# Patient Record
Sex: Male | Born: 2000 | Race: White | Hispanic: No | Marital: Single | Smoking: Never smoker
Health system: Southern US, Community
[De-identification: ages and names within clinical notes are randomized; demographics above are authoritative.]

---
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
## 2019-04-01 ENCOUNTER — Encounter: Payer: Self-pay | Admitting: Family Medicine

## 2019-04-01 ENCOUNTER — Ambulatory Visit (INDEPENDENT_AMBULATORY_CARE_PROVIDER_SITE_OTHER): Payer: Medicaid Other | Admitting: Family Medicine

## 2019-04-01 ENCOUNTER — Other Ambulatory Visit: Payer: Self-pay

## 2019-04-01 VITALS — BP 120/60 | HR 66 | Ht 68.0 in | Wt 146.4 lb

## 2019-04-01 DIAGNOSIS — Z8342 Family history of familial hypercholesterolemia: Secondary | ICD-10-CM | POA: Diagnosis not present

## 2019-04-01 DIAGNOSIS — Z0000 Encounter for general adult medical examination without abnormal findings: Secondary | ICD-10-CM | POA: Diagnosis not present

## 2019-04-01 DIAGNOSIS — E639 Nutritional deficiency, unspecified: Secondary | ICD-10-CM | POA: Diagnosis not present

## 2019-04-01 DIAGNOSIS — G479 Sleep disorder, unspecified: Secondary | ICD-10-CM | POA: Diagnosis not present

## 2019-04-01 NOTE — Assessment & Plan Note (Signed)
Although there is not acute concern for this a medical standpoint, due to family history and mother and patient level of concern about it, will obtain a lipid panel.

## 2019-04-01 NOTE — Progress Notes (Signed)
   Subjective:    Patient ID: Kenneth Foley, male    DOB: 2000/10/31, 18 y.o.   MRN: [id number]   CC: Annual physical  HPI: [name], 18 year old male with a history of autism presents for an annual physical today with his mother who provides history.  Patient is very high functioning autistic, but he has difficulty being in social situations and often feels as others are judging him.  He is an otherwise healthy young man with no particular concerns today.  His mother brings up concerns of his diet, he eats only fast food very few fruits and vegetables.  He also has trouble sleeping, he reports that he falls asleep around 1 AM, he will wake up around 3 AM to eat, and goes back to sleep.  He watches TV to fall asleep at night.  He only gets between 5 and 6 hours total on night.  His mother points out that he frequently on his phone and night and does not have a regular bedtime routine.  He is stopped walking and exercising regularly as of late.  They have a family history of diabetes, hypertension, high cholesterol.  He is not currently working, he does not have many close friends, prefers to be alone.  He does not smoke, drink alcohol, use recreational drugs of any type, vape, and he is not sexually active.  Smoking status reviewed   ROS: pertinent noted in the HPI   No past medical history on file.  No past surgical history on file.  Past medical history, surgical, family, and social history reviewed and updated in the EMR as appropriate.  Objective:  BP 120/60   Pulse 66   Ht 5\' 8"  (1.727 m)   Wt 146 lb 6 oz (66.4 kg)   SpO2 97%   BMI 22.26 kg/m   Vitals and nursing note reviewed  General: NAD, appropriately groomed young man, able to participate in exam Cardiac: RRR, S1 S2 present. normal heart sounds, no murmurs. Respiratory: CTAB, normal effort, No wheezes, rales or rhonchi Extremities: no edema or cyanosis. Skin: warm and dry, no rashes noted Neuro: alert, no  obvious focal deficits Psych: flat affect, averts gaze away from speaker, is uncomfortable maintaining eye contact while speaking, rate of speech is fast and monotone   Assessment & Plan:  Kenneth Foley is an otherwise healthy 18 year old man presenting for an annual physical today.  Difficulty sleeping Discussed appropriate sleep hygiene with patient, including using nightime setting on phone, decreasing electronic usage after 10 PM, publishing a regular routine to help the body wind down, exercising during the day to be more tired night, hand out given.  Poor diet Discussed with patient healthy eating habits, importance of this especially as 1 ages, and if one has a family history of diabetes.  Handout given.  Family history of high cholesterol Although there is not acute concern for this a medical standpoint, due to family history and mother and patient level of concern about it, will obtain a lipid panel.  Gladys Damme, MD [place] PGY-1

## 2019-04-01 NOTE — Assessment & Plan Note (Signed)
Discussed with patient healthy eating habits, importance of this especially as 1 ages, and if one has a family history of diabetes.  Handout given.

## 2019-04-01 NOTE — Patient Instructions (Addendum)
Pleasure seeing you today!  1.  I recommend you good use good sleep hygiene including turning on your phones night mild, or not using electronics after 10 PM.  2.  I recommend exercise and healthy eating, tips for which she can see below.  3.  I have taken some labs to test for high cholesterol, if anything is abnormal you will hear from me.  4.  Recommend regular self exam of your scrotum to prevent testicular cancer.  Be Well!  Health Maintenance, Male Adopting a healthy lifestyle and getting preventive care are important in promoting health and wellness. Ask your health care provider about:  The right schedule for you to have regular tests and exams.  Things you can do on your own to prevent diseases and keep yourself healthy. What should I know about diet, weight, and exercise? Eat a healthy diet   Eat a diet that includes plenty of vegetables, fruits, low-fat dairy products, and lean protein.  Do not eat a lot of foods that are high in solid fats, added sugars, or sodium. Maintain a healthy weight Body mass index (BMI) is a measurement that can be used to identify possible weight problems. It estimates body fat based on height and weight. Your health care provider can help determine your BMI and help you achieve or maintain a healthy weight. Get regular exercise Get regular exercise. This is one of the most important things you can do for your health. Most adults should:  Exercise for at least 150 minutes each week. The exercise should increase your heart rate and make you sweat (moderate-intensity exercise).  Do strengthening exercises at least twice a week. This is in addition to the moderate-intensity exercise.  Spend less time sitting. Even light physical activity can be beneficial. Watch cholesterol and blood lipids Have your blood tested for lipids and cholesterol at 19 years of age, then have this test every 5 years. You may need to have your cholesterol levels  checked more often if:  Your lipid or cholesterol levels are high.  You are older than 19 years of age.  You are at high risk for heart disease. What should I know about cancer screening? Many types of cancers can be detected early and may often be prevented. Depending on your health history and family history, you may need to have cancer screening at various ages. This may include screening for:  Colorectal cancer.  Prostate cancer.  Skin cancer.  Lung cancer. What should I know about heart disease, diabetes, and high blood pressure? Blood pressure and heart disease  High blood pressure causes heart disease and increases the risk of stroke. This is more likely to develop in people who have high blood pressure readings, are of African descent, or are overweight.  Talk with your health care provider about your target blood pressure readings.  Have your blood pressure checked: ? Every 3-5 years if you are 73-24 years of age. ? Every year if you are 27 years old or older.  If you are between the ages of 67 and 61 and are a current or former smoker, ask your health care provider if you should have a one-time screening for abdominal aortic aneurysm (AAA). Diabetes Have regular diabetes screenings. This checks your fasting blood sugar level. Have the screening done:  Once every three years after age 57 if you are at a normal weight and have a low risk for diabetes.  More often and at a younger age if you are  overweight or have a high risk for diabetes. What should I know about preventing infection? Hepatitis B If you have a higher risk for hepatitis B, you should be screened for this virus. Talk with your health care provider to find out if you are at risk for hepatitis B infection. Hepatitis C Blood testing is recommended for:  Everyone born from [birth year] through 1966.  Anyone with known risk factors for hepatitis C. Sexually transmitted infections (STIs)  You should be screened  each year for STIs, including gonorrhea and chlamydia, if: ? You are sexually active and are younger than 19 years of age. ? You are older than 19 years of age and your health care provider tells you that you are at risk for this type of infection. ? Your sexual activity has changed since you were last screened, and you are at increased risk for chlamydia or gonorrhea. Ask your health care provider if you are at risk.  Ask your health care provider about whether you are at high risk for HIV. Your health care provider may recommend a prescription medicine to help prevent HIV infection. If you choose to take medicine to prevent HIV, you should first get tested for HIV. You should then be tested every 3 months for as long as you are taking the medicine. Follow these instructions at home: Lifestyle  Do not use any products that contain nicotine or tobacco, such as cigarettes, e-cigarettes, and chewing tobacco. If you need help quitting, ask your health care provider.  Do not use street drugs.  Do not share needles.  Ask your health care provider for help if you need support or information about quitting drugs. Alcohol use  Do not drink alcohol if your health care provider tells you not to drink.  If you drink alcohol: ? Limit how much you have to 0-2 drinks a day. ? Be aware of how much alcohol is in your drink. In the [place] one drink equals one 12 oz bottle of beer (355 mL), one 5 oz glass of wine (148 mL), or one 1 oz glass of hard liquor (44 mL). General instructions  Schedule regular health, dental, and eye exams.  Stay current with your vaccines.  Tell your health care provider if: ? You often feel depressed. ? You have ever been abused or do not feel safe at home. Summary  Adopting a healthy lifestyle and getting preventive care are important in promoting health and wellness.  Follow your health care provider's instructions about healthy diet, exercising, and getting tested or  screened for diseases.  Follow your health care provider's instructions on monitoring your cholesterol and blood pressure. This information is not intended to replace advice given to you by your health care provider. Make sure you discuss any questions you have with your health care provider. Document Released: 12/14/2007 Document Revised: 06/10/2018 Document Reviewed: 06/10/2018 Elsevier Patient Education  2020 [employer].

## 2019-04-01 NOTE — Assessment & Plan Note (Signed)
Discussed appropriate sleep hygiene with patient, including using nightime setting on phone, decreasing electronic usage after 10 PM, publishing a regular routine to help the body wind down, exercising during the day to be more tired night, hand out given.

## 2019-04-02 LAB — LIPID PANEL
Chol/HDL Ratio: 4.2 ratio (ref 0.0–5.0)
Cholesterol, Total: 150 mg/dL (ref 100–169)
HDL: 36 mg/dL — ABNORMAL LOW (ref >39)
LDL Chol Calc (NIH): 96 mg/dL (ref 0–109)
Triglycerides: 95 mg/dL — ABNORMAL HIGH (ref 0–89)
VLDL Cholesterol Cal: 18 mg/dL (ref 5–40)

## 2019-04-07 NOTE — Progress Notes (Signed)
Discussed results with patient's mother, advised varying diet to have more fruits and vegetables, increase exercise like walking to improve triglycerides and HDL.

## 2019-10-08 ENCOUNTER — Encounter: Payer: Self-pay | Admitting: Family Medicine

## 2019-10-08 ENCOUNTER — Other Ambulatory Visit: Payer: Self-pay

## 2019-10-08 ENCOUNTER — Ambulatory Visit (INDEPENDENT_AMBULATORY_CARE_PROVIDER_SITE_OTHER): Payer: Medicaid Other | Admitting: Family Medicine

## 2019-10-08 VITALS — BP 118/64 | HR 62 | Ht 68.5 in | Wt 142.4 lb

## 2019-10-08 DIAGNOSIS — F840 Autistic disorder: Secondary | ICD-10-CM

## 2019-10-08 DIAGNOSIS — Z114 Encounter for screening for human immunodeficiency virus [HIV]: Secondary | ICD-10-CM | POA: Diagnosis present

## 2019-10-08 DIAGNOSIS — Z0000 Encounter for general adult medical examination without abnormal findings: Secondary | ICD-10-CM | POA: Diagnosis not present

## 2019-10-08 NOTE — Patient Instructions (Signed)
Physically you are doing well, I recommend you try to add in more fiber in your diet with vegetables and fruit. I also recommend regular exercise and drinking water as your main source of fluids daily.  For helping find ways to get along at home, in addition to the [facility] program you can seek therapy services through [facility], you can read more about their services and how to contact them at [email or web address]  Come back in 1 year for a physical exam.  Be Well!   Healthy Eating Following a healthy eating pattern may help you to achieve and maintain a healthy body weight, reduce the risk of chronic disease, and live a long and productive life. It is important to follow a healthy eating pattern at an appropriate calorie level for your body. Your nutritional needs should be met primarily through food by choosing a variety of nutrient-rich foods. What are tips for following this plan? Reading food labels  Read labels and choose the following: ? Reduced or low sodium. ? Juices with 100% fruit juice. ? Foods with low saturated fats and high polyunsaturated and monounsaturated fats. ? Foods with whole grains, such as whole wheat, cracked wheat, brown rice, and wild rice. ? Whole grains that are fortified with folic acid. This is recommended for women who are pregnant or who want to become pregnant.  Read labels and avoid the following: ? Foods with a lot of added sugars. These include foods that contain brown sugar, corn sweetener, corn syrup, dextrose, fructose, glucose, high-fructose corn syrup, honey, invert sugar, lactose, malt syrup, maltose, molasses, raw sugar, sucrose, trehalose, or turbinado sugar.  Do not eat more than the following amounts of added sugar per day:  6 teaspoons (25 g) for women.  9 teaspoons (38 g) for men. ? Foods that contain processed or refined starches and grains. ? Refined grain products, such as white flour, degermed cornmeal, white  bread, and white rice. Shopping  Choose nutrient-rich snacks, such as vegetables, whole fruits, and nuts. Avoid high-calorie and high-sugar snacks, such as potato chips, fruit snacks, and candy.  Use oil-based dressings and spreads on foods instead of solid fats such as butter, stick margarine, or cream cheese.  Limit pre-made sauces, mixes, and "instant" products such as flavored rice, instant noodles, and ready-made pasta.  Try more plant-protein sources, such as tofu, tempeh, black beans, edamame, lentils, nuts, and seeds.  Explore eating plans such as the Mediterranean diet or vegetarian diet. Cooking  Use oil to saut or stir-fry foods instead of solid fats such as butter, stick margarine, or lard.  Try baking, boiling, grilling, or broiling instead of frying.  Remove the fatty part of meats before cooking.  Steam vegetables in water or broth. Meal planning   At meals, imagine dividing your plate into fourths: ? One-half of your plate is fruits and vegetables. ? One-fourth of your plate is whole grains. ? One-fourth of your plate is protein, especially lean meats, poultry, eggs, tofu, beans, or nuts.  Include low-fat dairy as part of your daily diet. Lifestyle  Choose healthy options in all settings, including home, work, school, restaurants, or stores.  Prepare your food safely: ? Wash your hands after handling raw meats. ? Keep food preparation surfaces clean by regularly washing with hot, soapy water. ? Keep raw meats separate from ready-to-eat foods, such as fruits and vegetables. ? Cook seafood, meat, poultry, and eggs to the recommended internal temperature. ? Store foods at safe temperatures. In  general:  Keep cold foods at 20F (4.4C) or below.  Keep hot foods at 120F (60C) or above.  Keep your freezer at [facility] (-17.8C) or below.  Foods are no longer safe to eat when they have been between the temperatures of 40-120F (4.4-60C) for more than 2  hours. What foods should I eat? Fruits Aim to eat 2 cup-equivalents of fresh, canned (in natural juice), or frozen fruits each day. Examples of 1 cup-equivalent of fruit include 1 small apple, 8 large strawberries, 1 cup canned fruit,  cup dried fruit, or 1 cup 100% juice. Vegetables Aim to eat 2-3 cup-equivalents of fresh and frozen vegetables each day, including different varieties and colors. Examples of 1 cup-equivalent of vegetables include 2 medium carrots, 2 cups raw, leafy greens, 1 cup chopped vegetable (raw or cooked), or 1 medium baked potato. Grains Aim to eat 6 ounce-equivalents of whole grains each day. Examples of 1 ounce-equivalent of grains include 1 slice of bread, 1 cup ready-to-eat cereal, 3 cups popcorn, or  cup cooked rice, pasta, or cereal. Meats and other proteins Aim to eat 5-6 ounce-equivalents of protein each day. Examples of 1 ounce-equivalent of protein include 1 egg, 1/2 cup nuts or seeds, or 1 tablespoon (16 g) peanut butter. A cut of meat or fish that is the size of a deck of cards is about 3-4 ounce-equivalents.  Of the protein you eat each week, try to have at least 8 ounces come from seafood. This includes salmon, trout, herring, and anchovies. Dairy Aim to eat 3 cup-equivalents of fat-free or low-fat dairy each day. Examples of 1 cup-equivalent of dairy include 1 cup (240 mL) milk, 8 ounces (250 g) yogurt, 1 ounces (44 g) natural cheese, or 1 cup (240 mL) fortified soy milk. Fats and oils  Aim for about 5 teaspoons (21 g) per day. Choose monounsaturated fats, such as canola and olive oils, avocados, peanut butter, and most nuts, or polyunsaturated fats, such as sunflower, corn, and soybean oils, walnuts, pine nuts, sesame seeds, sunflower seeds, and flaxseed. Beverages  Aim for six 8-oz glasses of water per day. Limit coffee to three to five 8-oz cups per day.  Limit caffeinated beverages that have added calories, such as soda and energy drinks.  Limit  alcohol intake to no more than 1 drink a day for nonpregnant women and 2 drinks a day for men. One drink equals 12 oz of beer (355 mL), 5 oz of wine (148 mL), or 1 oz of hard liquor (44 mL). Seasoning and other foods  Avoid adding excess amounts of salt to your foods. Try flavoring foods with herbs and spices instead of salt.  Avoid adding sugar to foods.  Try using oil-based dressings, sauces, and spreads instead of solid fats. This information is based on general [place] nutrition guidelines. For more information, visit Build[employer].es. Exact amounts may vary based on your nutrition needs. Summary  A healthy eating plan may help you to maintain a healthy weight, reduce the risk of chronic diseases, and stay active throughout your life.  Plan your meals. Make sure you eat the right portions of a variety of nutrient-rich foods.  Try baking, boiling, grilling, or broiling instead of frying.  Choose healthy options in all settings, including home, work, school, restaurants, or stores. This information is not intended to replace advice given to you by your health care provider. Make sure you discuss any questions you have with your health care provider. Document Revised: 09/29/2017 Document Reviewed: 09/29/2017 Elsevier  Patient Education  [employer].

## 2019-10-12 ENCOUNTER — Encounter: Payer: Self-pay | Admitting: Family Medicine

## 2019-10-12 DIAGNOSIS — Z0000 Encounter for general adult medical examination without abnormal findings: Secondary | ICD-10-CM | POA: Insufficient documentation

## 2019-10-12 NOTE — Assessment & Plan Note (Signed)
Patient and family are having trouble getting along at home. Patient is convinced he does not have ASD and would like to be evaluated by [facility] program. His mother brought the form to be filled out for evaluation, which I completed.  - Evaluation form for [facility] completed - Family referred to [facility] for therapy to help resolve differences

## 2019-10-12 NOTE — Progress Notes (Signed)
SUBJECTIVE:   CHIEF COMPLAINT / HPI: form filled out, physical exam  Patient's mother is very concerned that patient has ASD, which he was previously diagnosed with, but that he also has restrictive eating patterns, poor sleep habits, does not exercise, spend time outdoors or socialize with other people. Patient shares a bedroom with his brother and is frequently up late, which can cause arguments between parents, brother, and patient. Patient only eats [employer] brand fried chicken and french fries for breakfast, lunch, and dinner. He mainly consumes sodas for fluids. He notes that he has trouble falling asleep, but this does not bother him. He currently works by [employer], [employer] and says he makes adequate funds this way. He prefers to be alone because he finds interacting with other people to be intrusive and unpleasant. He does not smoke, drink alcohol, or use drugs. He has never been sexually active and has no current symptoms.  PERTINENT  PMH / PSH: ASD, learning disability  OBJECTIVE:   BP 118/64   Pulse 62   Ht 5' 8.5" (1.74 m)   Wt 142 lb 6.4 oz (64.6 kg)   SpO2 98%   BMI 21.34 kg/m   Physical Exam Vitals and nursing note reviewed.  Constitutional:      General: He is not in acute distress.    Appearance: Normal appearance. He is normal weight. He is not ill-appearing, toxic-appearing or diaphoretic.  HENT:     Head: Normocephalic and atraumatic.     Nose: Nose normal.     Mouth/Throat:     Mouth: Mucous membranes are moist.     Pharynx: Oropharynx is clear.  Eyes:     Conjunctiva/sclera: Conjunctivae normal.     Pupils: Pupils are equal, round, and reactive to light.  Cardiovascular:     Rate and Rhythm: Normal rate and regular rhythm.     Pulses: Normal pulses.     Heart sounds: Normal heart sounds. No murmur. No friction rub. No gallop.   Pulmonary:     Effort: Pulmonary effort is normal.     Breath sounds: Normal breath sounds. No wheezing, rhonchi or  rales.  Abdominal:     General: Abdomen is flat. Bowel sounds are normal. There is no distension.     Palpations: Abdomen is soft.     Tenderness: There is no abdominal tenderness.  Musculoskeletal:     Right lower leg: No edema.     Left lower leg: No edema.  Skin:    General: Skin is warm and dry.     Capillary Refill: Capillary refill takes less than 2 seconds.  Neurological:     General: No focal deficit present.     Mental Status: He is alert and oriented to person, place, and time. Mental status is at baseline.    ASSESSMENT/PLAN:  Haydyn Girvan is a 19 yo male here for a physical and form fulfilled  Autism spectrum disorder Patient and family are having trouble getting along at home. Patient is convinced he does not have ASD and would like to be evaluated by [facility] program. His mother brought the form to be filled out for evaluation, which I completed.  - Evaluation form for [facility] completed - Family referred to [facility] for therapy to help resolve differences  Annual physical exam Patient has no complaints, is overall low risk for Hep C and HIV due to no sexual contact. He has no complaints, no other tests indicated at this time. Counseled  on healthy, plant based diet and sleep hygiene.   Shirlean Mylar, MD [facility]

## 2019-10-12 NOTE — Assessment & Plan Note (Signed)
Patient has no complaints, is overall low risk for Hep C and HIV due to no sexual contact. He has no complaints, no other tests indicated at this time. Counseled on healthy, plant based diet and sleep hygiene.

## 2020-10-05 ENCOUNTER — Ambulatory Visit: Payer: Medicaid Other | Admitting: Family Medicine

## 2020-11-14 ENCOUNTER — Ambulatory Visit (INDEPENDENT_AMBULATORY_CARE_PROVIDER_SITE_OTHER): Payer: Medicaid Other | Admitting: Family Medicine

## 2020-11-14 ENCOUNTER — Other Ambulatory Visit: Payer: Self-pay

## 2020-11-14 VITALS — BP 124/68 | HR 60 | Ht 69.0 in | Wt 145.1 lb

## 2020-11-14 DIAGNOSIS — Z0000 Encounter for general adult medical examination without abnormal findings: Secondary | ICD-10-CM | POA: Diagnosis present

## 2020-11-14 DIAGNOSIS — F840 Autistic disorder: Secondary | ICD-10-CM | POA: Diagnosis not present

## 2020-11-14 DIAGNOSIS — G479 Sleep disorder, unspecified: Secondary | ICD-10-CM

## 2020-11-14 NOTE — Progress Notes (Signed)
SUBJECTIVE:   Chief compliant/HPI: annual examination  Kenneth Foley is a 20 y.o. who presents today for an annual exam.   Patient is very concerned about the diagnosis of ASD on his chart.  He had a full psychologic evaluation with [facility], and he met multiple criteria for ASD as defined by DSM-V.  Patient feels that when people think he has autism this is pejorative or reflects poorly on him and can become quite upset.  His mother notes that he continues to have poor executive functioning.  He has not been able to hold down a steady job due to difficulty with waking up and following instructions.  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  Family also has added stress of father has been recently diagnosed with bladder cancer, currently undergoing chemo, will have bladder surgery in the near future.  Mother is particularly stressed.  Does not want to go to therapy as recommended by [facility].  Has very restrictive diet of soda chicken tenders and [place] fries.  Does not eat any vegetables.  He reports trouble sleeping.  He says he goes to bed at about 3 AM, has difficulty waking up by 7 AM for an time for work.  Prefers to wake up at about 10 AM.  He reports that he has trouble falling to sleep so then he will watch his phone, screen to help fall asleep.  He uses melatonin 5 mg nightly.  They have not yet tried sleep hygiene tactics.  PHQ-9 was low, score 4.  Overall low risk for depression  Patient reports that he is not sexually active at this time.  Reviewed and updated history.  Review of systems form- not notable.   OBJECTIVE:   BP 124/68   Pulse 60   Ht _0  (1.753 m)   Wt 145 lb 2 oz (65.8 kg)   SpO2 98%   BMI 21.43 kg/m   Nursing note and vitals reviewed GEN: age-appropriate, LM,  resting comfortably in chair, NAD, WNWD HEENT: NCAT. PERRLA. Sclera without injection or icterus. MMM.  Neck: Supple.  Cardiac: Regular rate and rhythm. Normal S1/S2. No murmurs, rubs, or gallops appreciated. 2+ radial pulses. Lungs: Clear bilaterally to ascultation. No increased WOB, no accessory muscle usage. No w/r/r. Abdomen: Normoactive bowel sounds. No tenderness to deep or light palpation. No rebound or guarding.   Neuro: Alert and at baseline, normal DTR patellar. Ext: no edema Psych: Pleasant and appropriate, but can become very upset when ASD is mentioned ASSESSMENT/PLAN:   Difficulty sleeping Counseled patient on sleep hygiene, recommend implementing these tactics before further work up. Do not recommend medication.  Autism spectrum disorder Recommended therapy for mother to help cope with stress of husband's illness and how to best interact with her son. Suggest the family find jobs that suit Levester's strengths rather than try to make him fit a role that won't work for him. Emotional support and therapy resources given.  Annual physical exam Patient is in overall good physical health. Counseled on balanced nutrition due to restrictive eating pattern.    Annual Examination  See AVS for age appropriate recommendations  PHQ score 4, reviewed and discussed.  Blood pressure reviewed and at goal.   Considered the following items based upon USPSTF recommendations: HIV testing: not  indicated Hepatitis C: not indicated Hepatitis B: not indicated Syphilis if at high risk: not indicated GC/CTnot indicated Lipid panel (nonfasting or fasting) discussed based upon AHA recommendations and not ordered, as it is not currently indicated. Consider repeat every 4-6 years.  Reviewed risk factors for latent tuberculosis and not indicated Immunizations UTD.   Follow up in 1 year or sooner if indicated.    Gladys Damme, MD [place] \

## 2020-11-14 NOTE — Patient Instructions (Addendum)
It was a pleasure to see you today!  1. I have listed some resources below for your mother. Try changing sleep habits by limiting screen time by 1 hour before bed time, and finding a routine that works for you (warm show before bed, caffeine free hot tea, listening to a meditation, etc). Try this for 6 weeks and wake up at your desired time- even if you are sleepy. You will find you can train yourself to go to sleep earlier and wake up when you want to.  2. Follow up in 1 year  Be Well,  Dr. Leary Roca  Outpatient Mental Health Providers (No Insurance required or Self Pay)  [facility]  [phone] [email or web address] Marcy Panning and [facility]  [address] [place], [place] [phone] Crisis [phone]  [facility]) can see uninsured folks for outpatient therapy [email or web address] [address] [place], [place] [address] [phone]  [facility]    Walk-in Mon-Fri, 8am-3pm [email or web address] Scull [address], [place], [place]  [phone]   [address]  [place] [phone] [facility] for psych med management, there may be a wait- if [facility] is working with clients for OPT, they will coordinate with [facility] for psych  [facility]   Walk-in-Clinic: Monday- Friday 9:00 AM - 4:00 PM [address]   [place], [place] [phone]  [facility] [place] ([employer]) walk in M-F 8am-12pm and  1pm-3pm [place]- [address]     [phone]  [employer] [address]  Phone: [phone]  [employer] (Mental Health and substance challenges) [address]   [place] [phone]    [email or web address]    [facility]  [place] -[address] [place], [place]     Phone:  [phone] [place]-  [address]  [phone]    [facility]  [address] [place]  [phone] [email or web address]   [facility] ( virtual or zoom therapy) [email or web address]  [address] [place]  [phone]    [facility] [phone]  grief counseling, dementia and caregiver support    [facility] Walk-in MWF 12:30 to 3:00     [address] [place] [address]  [phone]  [email or web address] call to schedule an appointment    [facility]ental [facility] Classes ,Support group, Peer support services, [address] [place], [place] [address] [phone]  PhotoSolver.pl           National Alliance on Mental Illness (NAMI) [place]- Wellness classes, Support groups        [address], [place], [place] [address] [phone]   [email or web address]   [facility]  (Psycho-social Rehabilitation clubhouse, Individual and group therapy) [address] [place], [place] [address]   [phone]  24- Hour Availability:  [facility] [phone] or [phone] * [facility] [employer] (Domestic Violence, Rape, etc. )[phone] Vesta Mixer [phone] or [phone] * [facility] [phone] (after hours) *[facility] [phone] *[place] National Suicide Hotline 819-740-5983 (TALK)   Health Maintenance, Male Adopting a healthy lifestyle and getting preventive care are important in promoting health and wellness. Ask your health care provider about:  The right schedule for you to have regular tests and exams.  Things you can  do on your own to prevent diseases and keep yourself healthy. What should I know about diet, weight, and exercise? Eat a healthy diet  Eat a diet that includes plenty of vegetables, fruits, low-fat dairy products, and lean protein.  Do not eat a lot of foods that are high in  solid fats, added sugars, or sodium.   Maintain a healthy weight Body mass index (BMI) is a measurement that can be used to identify possible weight problems. It estimates body fat based on height and weight. Your health care provider can help determine your BMI and help you achieve or maintain a healthy weight. Get regular exercise Get regular exercise. This is one of the most important things you can do for your health. Most adults should:  Exercise for at least 150 minutes each week. The exercise should increase your heart rate and make you sweat (moderate-intensity exercise).  Do strengthening exercises at least twice a week. This is in addition to the moderate-intensity exercise.  Spend less time sitting. Even light physical activity can be beneficial. Watch cholesterol and blood lipids Have your blood tested for lipids and cholesterol at 21 years of age, then have this test every 5 years. You may need to have your cholesterol levels checked more often if:  Your lipid or cholesterol levels are high.  You are older than 21 years of age.  You are at high risk for heart disease. What should I know about cancer screening? Many types of cancers can be detected early and may often be prevented. Depending on your health history and family history, you may need to have cancer screening at various ages. This may include screening for:  Colorectal cancer.  Prostate cancer.  Skin cancer.  Lung cancer. What should I know about heart disease, diabetes, and high blood pressure? Blood pressure and heart disease  High blood pressure causes heart disease and increases the risk of stroke. This is more likely to develop in people who have high blood pressure readings, are of African descent, or are overweight.  Talk with your health care provider about your target blood pressure readings.  Have your blood pressure checked: ? Every 3-5 years if you are 21-21 years of age. ? Every year if you  are 19 years old or older.  If you are between the ages of 35 and 75 and are a current or former smoker, ask your health care provider if you should have a one-time screening for abdominal aortic aneurysm (AAA). Diabetes Have regular diabetes screenings. This checks your fasting blood sugar level. Have the screening done:  Once every three years after age 67 if you are at a normal weight and have a low risk for diabetes.  More often and at a younger age if you are overweight or have a high risk for diabetes. What should I know about preventing infection? Hepatitis B If you have a higher risk for hepatitis B, you should be screened for this virus. Talk with your health care provider to find out if you are at risk for hepatitis B infection. Hepatitis C Blood testing is recommended for:  Everyone born from [birth year] through 1966.  Anyone with known risk factors for hepatitis C. Sexually transmitted infections (STIs)  You should be screened each year for STIs, including gonorrhea and chlamydia, if: ? You are sexually active and are younger than 21 years of age. ? You are older than 21 years of age and your health care provider tells you that you  are at risk for this type of infection. ? Your sexual activity has changed since you were last screened, and you are at increased risk for chlamydia or gonorrhea. Ask your health care provider if you are at risk.  Ask your health care provider about whether you are at high risk for HIV. Your health care provider may recommend a prescription medicine to help prevent HIV infection. If you choose to take medicine to prevent HIV, you should first get tested for HIV. You should then be tested every 3 months for as long as you are taking the medicine. Follow these instructions at home: Lifestyle  Do not use any products that contain nicotine or tobacco, such as cigarettes, e-cigarettes, and chewing tobacco. If you need help quitting, ask your health care  provider.  Do not use street drugs.  Do not share needles.  Ask your health care provider for help if you need support or information about quitting drugs. Alcohol use  Do not drink alcohol if your health care provider tells you not to drink.  If you drink alcohol: ? Limit how much you have to 0-2 drinks a day. ? Be aware of how much alcohol is in your drink. In the [place] one drink equals one 12 oz bottle of beer (355 mL), one 5 oz glass of wine (148 mL), or one 1 oz glass of hard liquor (44 mL). General instructions  Schedule regular health, dental, and eye exams.  Stay current with your vaccines.  Tell your health care provider if: ? You often feel depressed. ? You have ever been abused or do not feel safe at home. Summary  Adopting a healthy lifestyle and getting preventive care are important in promoting health and wellness.  Follow your health care provider's instructions about healthy diet, exercising, and getting tested or screened for diseases.  Follow your health care provider's instructions on monitoring your cholesterol and blood pressure. This information is not intended to replace advice given to you by your health care provider. Make sure you discuss any questions you have with your health care provider. Document Revised: 06/10/2018 Document Reviewed: 06/10/2018 Elsevier Patient Education  2021 [employer].

## 2020-11-14 NOTE — Assessment & Plan Note (Signed)
Recommended therapy for mother to help cope with stress of husband's illness and how to best interact with her son. Suggest the family find jobs that suit Kenneth Foley's strengths rather than try to make him fit a role that won't work for him. Emotional support and therapy resources given.

## 2020-11-14 NOTE — Assessment & Plan Note (Signed)
Patient is in overall good physical health. Counseled on balanced nutrition due to restrictive eating pattern.

## 2020-11-14 NOTE — Assessment & Plan Note (Signed)
Counseled patient on sleep hygiene, recommend implementing these tactics before further work up. Do not recommend medication.

## 2021-11-20 ENCOUNTER — Ambulatory Visit (INDEPENDENT_AMBULATORY_CARE_PROVIDER_SITE_OTHER): Payer: Medicaid Other | Admitting: Family Medicine

## 2021-11-20 VITALS — BP 130/75 | HR 63 | Wt 145.8 lb

## 2021-11-20 DIAGNOSIS — Z8342 Family history of familial hypercholesterolemia: Secondary | ICD-10-CM

## 2021-11-20 DIAGNOSIS — Z0000 Encounter for general adult medical examination without abnormal findings: Secondary | ICD-10-CM | POA: Diagnosis not present

## 2021-11-20 NOTE — Assessment & Plan Note (Signed)
Check lipid panel and CMP. 

## 2021-11-20 NOTE — Progress Notes (Signed)
    SUBJECTIVE:   Chief compliant/HPI: annual examination  Kenneth Foley is a 21 y.o. who presents today for an annual exam. No concerns, not sexually active. He is working for his Dispensing optician. Mother requests lipid panel as family has hx of HLD and patient has ASD, only eats chicken nuggets/fries- very restrictive diet.  Reviewed and updated history.   Review of systems form not notable.   OBJECTIVE:   BP 130/75   Pulse 63   Wt 145 lb 12.8 oz (66.1 kg)   SpO2 100%   BMI 21.53 kg/m   Nursing note and vitals reviewed GEN:  resting comfortably in chair, NAD, WNWD HEENT: NCAT. PERRLA. Sclera without injection or icterus. MMM.  Neck: Supple.  Cardiac: Regular rate and rhythm. Normal S1/S2. No murmurs, rubs, or gallops appreciated. 2+ radial pulses. Lungs: Clear bilaterally to ascultation. No increased WOB, no accessory muscle usage. No w/r/r. Abdomen: Soft, NT, ND. Normoactive bowel sounds.   Neuro: AOx3  Ext: no edema Psych: Pleasant and appropriate   ASSESSMENT/PLAN:   Family history of high cholesterol Check lipid panel and CMP.    Annual Examination  See AVS for age appropriate recommendations  PHQ score 0, reviewed and discussed.  Blood pressure reviewed and at goal.     Considered the following items based upon USPSTF recommendations: HIV testing: not indicated Hepatitis C: not indicated Hepatitis B: not indicated Syphilis if at high risk: not indicated GC/CTnot indicated Lipid panel (nonfasting or fasting) discussed based upon AHA recommendations and ordered.  Consider repeat every 4-6 years.  Reviewed risk factors for latent tuberculosis and not indicated Immunizations UTD   Follow up in 1 year or sooner if indicated.    Shirlean Mylar, MD [facility]

## 2021-11-21 LAB — COMPREHENSIVE METABOLIC PANEL
ALT: 22 IU/L (ref 0–44)
AST: 30 IU/L (ref 0–40)
Albumin/Globulin Ratio: 1.8 (ref 1.2–2.2)
Albumin: 4.6 g/dL (ref 4.1–5.2)
Alkaline Phosphatase: 68 IU/L (ref 44–121)
BUN/Creatinine Ratio: 12 (ref 9–20)
BUN: 11 mg/dL (ref 6–20)
Bilirubin Total: 2.6 mg/dL — ABNORMAL HIGH (ref 0.0–1.2)
CO2: 24 mmol/L (ref 20–29)
Calcium: 9.1 mg/dL (ref 8.7–10.2)
Chloride: 100 mmol/L (ref 96–106)
Creatinine, Ser: 0.89 mg/dL (ref 0.76–1.27)
Globulin, Total: 2.6 g/dL (ref 1.5–4.5)
Glucose: 99 mg/dL (ref 70–99)
Potassium: 4 mmol/L (ref 3.5–5.2)
Sodium: 138 mmol/L (ref 134–144)
Total Protein: 7.2 g/dL (ref 6.0–8.5)
eGFR: 124 mL/min/{1.73_m2} (ref >59)

## 2021-11-21 LAB — LIPID PANEL
Chol/HDL Ratio: 3.8 ratio (ref 0.0–5.0)
Cholesterol, Total: 144 mg/dL (ref 100–199)
HDL: 38 mg/dL — ABNORMAL LOW (ref >39)
LDL Chol Calc (NIH): 91 mg/dL (ref 0–99)
Triglycerides: 76 mg/dL (ref 0–149)
VLDL Cholesterol Cal: 15 mg/dL (ref 5–40)

## 2021-11-28 ENCOUNTER — Other Ambulatory Visit: Payer: Medicaid Other

## 2021-11-28 ENCOUNTER — Telehealth: Payer: Self-pay | Admitting: Family Medicine

## 2021-11-28 DIAGNOSIS — Z1159 Encounter for screening for other viral diseases: Secondary | ICD-10-CM

## 2021-11-28 NOTE — Telephone Encounter (Signed)
Patient with mildly elevated bilirubin, CMP otherwise normal. Will check fractionated bili, and haptoglobin, path smear in case unconjugated. Also will do routine HCV screen and CBC to look for anemia.  Discussed with patient's mother (per phone note). Patient did not answer cell phone.   Scheduled for lab appt this afternoon (patient's day off). Labs pended as future.  Shirlean Mylar, MD [facility] Family Medicine Residency, PGY-3

## 2021-11-29 LAB — CBC WITH DIFFERENTIAL/PLATELET
Basophils Absolute: 0.1 10*3/uL (ref 0.0–0.2)
Basos: 1 %
EOS (ABSOLUTE): 0.1 10*3/uL (ref 0.0–0.4)
Eos: 1 %
Hematocrit: 46.7 % (ref 37.5–51.0)
Hemoglobin: 15.4 g/dL (ref 13.0–17.7)
Immature Grans (Abs): 0 10*3/uL (ref 0.0–0.1)
Immature Granulocytes: 0 %
Lymphocytes Absolute: 1.9 10*3/uL (ref 0.7–3.1)
Lymphs: 37 %
MCH: 28.6 pg (ref 26.6–33.0)
MCHC: 33 g/dL (ref 31.5–35.7)
MCV: 87 fL (ref 79–97)
Monocytes Absolute: 0.4 10*3/uL (ref 0.1–0.9)
Monocytes: 8 %
Neutrophils Absolute: 2.8 10*3/uL (ref 1.4–7.0)
Neutrophils: 53 %
Platelets: 198 10*3/uL (ref 150–450)
RBC: 5.38 x10E6/uL (ref 4.14–5.80)
RDW: 13.1 % (ref 11.6–15.4)
WBC: 5.2 10*3/uL (ref 3.4–10.8)

## 2021-11-29 LAB — BILIRUBIN, FRACTIONATED(TOT/DIR/INDIR)
Bilirubin Total: 1.3 mg/dL — ABNORMAL HIGH (ref 0.0–1.2)
Bilirubin, Direct: 0.12 mg/dL (ref 0.00–0.40)
Bilirubin, Indirect: 1.18 mg/dL — ABNORMAL HIGH (ref 0.10–0.80)

## 2021-11-29 LAB — HCV AB W REFLEX TO QUANT PCR: HCV Ab: NONREACTIVE

## 2021-11-29 LAB — HCV INTERPRETATION

## 2021-11-29 LAB — HAPTOGLOBIN: Haptoglobin: 20 mg/dL (ref 17–317)

## 2021-12-04 ENCOUNTER — Telehealth: Payer: Self-pay

## 2021-12-04 NOTE — Telephone Encounter (Signed)
Patients mother walks into [facility] requesting lab results from 5/31.   Please advise.

## 2021-12-07 NOTE — Telephone Encounter (Signed)
Awaiting results from blood smear. 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 Not dangerous, recommend using caution with tylenol (do not take more than 2 grams per day). Called pt/mother. Left HIPAA compliant VM. Ok to tell them the above.  Shirlean Mylar, MD [facility] Family Medicine Residency, PGY-3

## 2021-12-13 LAB — PATHOLOGIST SMEAR REVIEW
Basophils Absolute: 0.1 10*3/uL (ref 0.0–0.2)
Basos: 1 %
EOS (ABSOLUTE): 0.1 10*3/uL (ref 0.0–0.4)
Eos: 1 %
Hematocrit: 46.2 % (ref 37.5–51.0)
Hemoglobin: 15.2 g/dL (ref 13.0–17.7)
Immature Grans (Abs): 0 10*3/uL (ref 0.0–0.1)
Immature Granulocytes: 0 %
Lymphocytes Absolute: 1.8 10*3/uL (ref 0.7–3.1)
Lymphs: 37 %
MCH: 28.3 pg (ref 26.6–33.0)
MCHC: 32.9 g/dL (ref 31.5–35.7)
MCV: 86 fL (ref 79–97)
Monocytes Absolute: 0.4 10*3/uL (ref 0.1–0.9)
Monocytes: 8 %
Neutrophils Absolute: 2.7 10*3/uL (ref 1.4–7.0)
Neutrophils: 53 %
Path Rev PLTs: NORMAL
Path Rev RBC: NORMAL
Path Rev WBC: NORMAL
Platelets: 210 10*3/uL (ref 150–450)
RBC: 5.37 x10E6/uL (ref 4.14–5.80)
RDW: 13.1 % (ref 11.6–15.4)
WBC: 5 10*3/uL (ref 3.4–10.8)

## 2021-12-19 ENCOUNTER — Telehealth: Payer: Self-pay | Admitting: Family Medicine

## 2021-12-19 NOTE — Telephone Encounter (Signed)
Platelets and rbc's normal on pathology review of smear. 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 This is not dangerous. I recommend that he limits use of tylenol, prefer ibuprofen for pain. Recommend recheck of bilirubin in 1 month, they can make lab appointment for that.   Left HIPAA compliant VM. If mother or patient return call, please let them know the above.  Shirlean Mylar, MD [facility] Family Medicine Residency, PGY-3

## 2022-01-04 ENCOUNTER — Encounter (HOSPITAL_COMMUNITY): Payer: Self-pay

## 2022-01-04 ENCOUNTER — Ambulatory Visit (HOSPITAL_COMMUNITY)
Admission: EM | Admit: 2022-01-04 | Discharge: 2022-01-04 | Disposition: A | Discharge status: Home / Self Care | Payer: Medicaid Other | Attending: Family Medicine | Admitting: Family Medicine

## 2022-01-04 DIAGNOSIS — L237 Allergic contact dermatitis due to plants, except food: Secondary | ICD-10-CM

## 2022-01-04 MED ORDER — TRIAMCINOLONE ACETONIDE 40 MG/ML IJ SUSP
40.0000 mg | Freq: Once | INTRAMUSCULAR | Status: AC
  Administered 2022-01-04: 40 mg via INTRAMUSCULAR

## 2022-01-04 MED ORDER — TRIAMCINOLONE ACETONIDE 40 MG/ML IJ SUSP
INTRAMUSCULAR | Status: AC
  Filled 2022-01-04: qty 1

## 2022-01-04 MED ORDER — PREDNISONE 20 MG PO TABS: 40.0000 mg | ORAL_TABLET | Freq: Every day | ORAL | 0 refills | Status: AC

## 2022-01-04 NOTE — ED Triage Notes (Signed)
Pt c/o rash to hands, abdomen, and neck x2 week. States using poison ivy creams with no relief.

## 2022-01-04 NOTE — Discharge Instructions (Addendum)
You have been given a shot of triamcinolone 40 mg  Prednisone 20 mg--Take 2 daily for 5 days.  You can take Benadryl or Zyrtec as needed for itching

## 2022-01-04 NOTE — ED Provider Notes (Signed)
[facility]    CSN: [id number] Arrival date & time: 01/04/22  1920      History   Chief Complaint Chief Complaint  Patient presents with   Rash    HPI Kenneth Foley is a 21 y.o. male.    Rash  Here for itching and rash that began about a week ago but then it got worse again on July 3.  No fever or cough or shortness of breath.  History reviewed. No pertinent past medical history.  Patient Active Problem List   Diagnosis Date Noted   Annual physical exam 10/12/2019   Poor diet 05/27/2018   Family history of diabetes mellitus type II 05/27/2018   Family history of high cholesterol 05/27/2018   Difficulty sleeping 03/24/2018   Learning disability:  Nonverbal Composite DAS:  117 08/23/2014   Autism spectrum disorder 09/23/2013    History reviewed. No pertinent surgical history.     Home Medications    Prior to Admission medications   Medication Sig Start Date End Date Taking? Authorizing Provider  predniSONE (DELTASONE) 20 MG tablet Take 2 tablets (40 mg total) by mouth daily with breakfast for 5 days. 01/04/22 01/09/22 Yes Mujahid Jalomo, Janace Aris, MD    Family History History reviewed. No pertinent family history.  Social History Social History   Tobacco Use   Smoking status: Never   Smokeless tobacco: Never  Substance Use Topics   Alcohol use: Not Currently   Drug use: Not Currently     Allergies   Patient has no known allergies.   Review of Systems Review of Systems  Skin:  Positive for rash.     Physical Exam Triage Vital Signs ED Triage Vitals [01/04/22 1935]  Enc Vitals Group     BP 128/66     Pulse Rate 63     Resp 18     Temp 99.2 F (37.3 C)     Temp Source Oral     SpO2 98 %     Weight      Height      Head Circumference      Peak Flow      Pain Score 0     Pain Loc      Pain Edu?      Excl. in GC?    No data found.  Updated Vital Signs BP 128/66 (BP Location: Left Arm)   Pulse 63   Temp 99.2 F (37.3 C)  (Oral)   Resp 18   SpO2 98%   Visual Acuity Right Eye Distance:   Left Eye Distance:   Bilateral Distance:    Right Eye Near:   Left Eye Near:    Bilateral Near:     Physical Exam Vitals reviewed.  Constitutional:      General: He is not in acute distress.    Appearance: He is not toxic-appearing.  Eyes:     Extraocular Movements: Extraocular movements intact.     Pupils: Pupils are equal, round, and reactive to light.  Cardiovascular:     Rate and Rhythm: Normal rate and regular rhythm.     Heart sounds: No murmur heard. Pulmonary:     Effort: Pulmonary effort is normal.     Breath sounds: Normal breath sounds.  Musculoskeletal:     Cervical back: Neck supple.  Lymphadenopathy:     Cervical: No cervical adenopathy.  Skin:    Findings: Rash (Maculopapular rash with some linear rash on arms and neck) present.  Neurological:  Mental Status: He is alert and oriented to person, place, and time.  Psychiatric:        Behavior: Behavior normal.      UC Treatments / Results  Labs (all labs ordered are listed, but only abnormal results are displayed) Labs Reviewed - No data to display  EKG   Radiology No results found.  Procedures Procedures (including critical care time)  Medications Ordered in UC Medications  triamcinolone acetonide (KENALOG-40) injection 40 mg (has no administration in time range)    Initial Impression / Assessment and Plan / UC Course  I have reviewed the triage vital signs and the nursing notes.  Pertinent labs & imaging results that were available during my care of the patient were reviewed by me and considered in my medical decision making (see chart for details).     We will treat with oral steroids and a shot of triamcinolone tonight. Final Clinical Impressions(s) / UC Diagnoses   Final diagnoses:  Allergic contact dermatitis due to plants, except food     Discharge Instructions      You have been given a shot of  triamcinolone 40 mg  Prednisone 20 mg--Take 2 daily for 5 days.  You can take Benadryl or Zyrtec as needed for itching     ED Prescriptions     Medication Sig Dispense Auth. Provider   predniSONE (DELTASONE) 20 MG tablet Take 2 tablets (40 mg total) by mouth daily with breakfast for 5 days. 10 tablet Marlinda Mike Janace Aris, MD      PDMP not reviewed this encounter.   Zenia Resides, MD 01/04/22 [phone]

## 2022-01-25 ENCOUNTER — Encounter (HOSPITAL_COMMUNITY): Payer: Self-pay

## 2022-01-25 ENCOUNTER — Ambulatory Visit (HOSPITAL_COMMUNITY)
Admission: EM | Admit: 2022-01-25 | Discharge: 2022-01-25 | Disposition: A | Discharge status: Home / Self Care | Payer: Medicaid Other | Attending: Internal Medicine | Admitting: Internal Medicine

## 2022-01-25 DIAGNOSIS — R21 Rash and other nonspecific skin eruption: Secondary | ICD-10-CM | POA: Diagnosis not present

## 2022-01-25 DIAGNOSIS — T63441A Toxic effect of venom of bees, accidental (unintentional), initial encounter: Secondary | ICD-10-CM | POA: Diagnosis not present

## 2022-01-25 MED ORDER — CETIRIZINE HCL 10 MG PO TABS: 10.0000 mg | ORAL_TABLET | Freq: Every day | ORAL | 0 refills | Status: DC

## 2022-01-25 MED ORDER — FAMOTIDINE 20 MG PO TABS: 20.0000 mg | ORAL_TABLET | Freq: Two times a day (BID) | ORAL | 0 refills | Status: DC

## 2022-01-25 MED ORDER — METHYLPREDNISOLONE SODIUM SUCC 125 MG IJ SOLR
INTRAMUSCULAR | Status: AC
  Filled 2022-01-25: qty 2

## 2022-01-25 MED ORDER — METHYLPREDNISOLONE SODIUM SUCC 125 MG IJ SOLR
80.0000 mg | Freq: Once | INTRAMUSCULAR | Status: AC
  Administered 2022-01-25: 80 mg via INTRAMUSCULAR

## 2022-01-25 NOTE — ED Provider Notes (Signed)
[facility]    CSN: [id number] Arrival date & time: 01/25/22  1812      History   Chief Complaint Chief Complaint  Patient presents with   Insect Bite    Hornet stings all over    HPI Kenneth Foley is a 21 y.o. male.   Patient presents with multiple areas of bee/hornet stings that occurred a few hours prior to arrival.  Patient reports that he was mowing the lawn when he got into an area with multiple different bees/hornets.  He denies feelings of throat closing or shortness of breath.  He has not taken any medications for symptoms.  There are multiple bee stings present to face, abdomen, back, arms, knees.     History reviewed. No pertinent past medical history.  Patient Active Problem List   Diagnosis Date Noted   Annual physical exam 10/12/2019   Poor diet 05/27/2018   Family history of diabetes mellitus type II 05/27/2018   Family history of high cholesterol 05/27/2018   Difficulty sleeping 03/24/2018   Learning disability:  Nonverbal Composite DAS:  117 08/23/2014   Autism spectrum disorder 09/23/2013    History reviewed. No pertinent surgical history.     Home Medications    Prior to Admission medications   Medication Sig Start Date End Date Taking? Authorizing Provider  cetirizine (ZYRTEC) 10 MG tablet Take 1 tablet (10 mg total) by mouth daily. 01/25/22  Yes Camaria Gerald, Acie Fredrickson, FNP  famotidine (PEPCID) 20 MG tablet Take 1 tablet (20 mg total) by mouth 2 (two) times daily. 01/25/22  Yes Gustavus Bryant, FNP    Family History History reviewed. No pertinent family history.  Social History Social History   Tobacco Use   Smoking status: Never   Smokeless tobacco: Never  Substance Use Topics   Alcohol use: Not Currently   Drug use: Never     Allergies   Patient has no known allergies.   Review of Systems Review of Systems Per HPI  Physical Exam Triage Vital Signs ED Triage Vitals [01/25/22 1822]  Enc Vitals Group     BP 122/80      Pulse Rate 66     Resp 16     Temp 98 F (36.7 C)     Temp Source Oral     SpO2 97 %     Weight 140 lb (63.5 kg)     Height 5\' 8"  (1.727 m)     Head Circumference      Peak Flow      Pain Score 8     Pain Loc      Pain Edu?      Excl. in GC?    No data found.  Updated Vital Signs BP 122/80 (BP Location: Left Arm)   Pulse 66   Temp 98 F (36.7 C) (Oral)   Resp 16   Ht 5\' 8"  (1.727 m)   Wt 140 lb (63.5 kg)   SpO2 97%   BMI 21.29 kg/m   Visual Acuity Right Eye Distance:   Left Eye Distance:   Bilateral Distance:    Right Eye Near:   Left Eye Near:    Bilateral Near:     Physical Exam Constitutional:      General: He is not in acute distress.    Appearance: Normal appearance. He is not toxic-appearing or diaphoretic.  HENT:     Head: Normocephalic and atraumatic.     Comments: Tongue normal    Mouth/Throat:  Pharynx: No pharyngeal swelling.  Eyes:     Extraocular Movements: Extraocular movements intact.     Conjunctiva/sclera: Conjunctivae normal.  Cardiovascular:     Rate and Rhythm: Normal rate and regular rhythm.     Pulses: Normal pulses.     Heart sounds: Normal heart sounds.  Pulmonary:     Effort: Pulmonary effort is normal. No respiratory distress.     Breath sounds: Normal breath sounds. No stridor. No wheezing, rhonchi or rales.  Skin:    Comments: Multiple different areas of bee stings with surrounding mild erythema located to back, armpits, bilateral upper extremities, abdomen, forehead, posterior neck, bilateral knees.  Neurological:     General: No focal deficit present.     Mental Status: He is alert and oriented to person, place, and time. Mental status is at baseline.  Psychiatric:        Mood and Affect: Mood normal.        Behavior: Behavior normal.        Thought Content: Thought content normal.        Judgment: Judgment normal.      UC Treatments / Results  Labs (all labs ordered are listed, but only abnormal results are  displayed) Labs Reviewed - No data to display  EKG   Radiology No results found.  Procedures Procedures (including critical care time)  Medications Ordered in UC Medications  methylPREDNISolone sodium succinate (SOLU-MEDROL) 125 mg/2 mL injection 80 mg (has no administration in time range)    Initial Impression / Assessment and Plan / UC Course  I have reviewed the triage vital signs and the nursing notes.  Pertinent labs & imaging results that were available during my care of the patient were reviewed by me and considered in my medical decision making (see chart for details).     Patient has rash consistent with bee stings to multiple areas of the body including the face.  Will treat with IM Solu-Medrol given how diffuse it is.  There are no signs of anaphylaxis, stridor, wheezing, respiratory distress so will defer EpiPen.  Cetirizine antihistamine and Pepcid also prescribed for patient to take.  Patient was given strict return and ER precautions.  Patient and caregiver verbalized understanding and were agreeable with plan. Final Clinical Impressions(s) / UC Diagnoses   Final diagnoses:  Bee sting, accidental or unintentional, initial encounter  Rash and nonspecific skin eruption     Discharge Instructions      You have been given a steroid shot in urgent care to help decrease inflammation and allergic reaction related to the bee stings.  Two other medications have been prescribed for you to take until symptoms resolve.  Please follow-up if symptoms persist or worsen.   ED Prescriptions     Medication Sig Dispense Auth. Provider   cetirizine (ZYRTEC) 10 MG tablet Take 1 tablet (10 mg total) by mouth daily. 30 tablet [place], Disautel E, [place]   famotidine (PEPCID) 20 MG tablet Take 1 tablet (20 mg total) by mouth 2 (two) times daily. 30 tablet [place], Acie Fredrickson, [place]      PDMP not reviewed this encounter.   Gustavus Bryant, [place] 01/25/22 1836

## 2022-01-25 NOTE — Discharge Instructions (Signed)
You have been given a steroid shot in urgent care to help decrease inflammation and allergic reaction related to the bee stings.  Two other medications have been prescribed for you to take until symptoms resolve.  Please follow-up if symptoms persist or worsen.

## 2022-01-25 NOTE — ED Triage Notes (Signed)
Patient was stung by multiple hornets on the back today. Has whelps and rashes on the back, bilateral flank rashes, and a rash on the right knee.

## 2022-11-21 ENCOUNTER — Encounter: Payer: Self-pay | Admitting: Family Medicine

## 2022-11-21 ENCOUNTER — Ambulatory Visit (INDEPENDENT_AMBULATORY_CARE_PROVIDER_SITE_OTHER): Payer: Medicare Other | Admitting: Family Medicine

## 2022-11-21 VITALS — BP 130/70 | HR 77 | Ht 68.0 in | Wt 149.0 lb

## 2022-11-21 DIAGNOSIS — Z0000 Encounter for general adult medical examination without abnormal findings: Secondary | ICD-10-CM

## 2022-11-21 DIAGNOSIS — Z23 Encounter for immunization: Secondary | ICD-10-CM | POA: Diagnosis present

## 2022-11-21 NOTE — Assessment & Plan Note (Signed)
-  doing well -discussed the importance of lifestyle modifications, handout provided. Emphasized the importance of continued balanced diet. Encouraged the importance of staying active. -Med rec reviewed -PHQ-9 score of 5 with negative question 9 reviewed.  -Tdap administered today, patient tolerated well without complications -follow up in 1 year or sooner as appropriate

## 2022-11-21 NOTE — Progress Notes (Signed)
    SUBJECTIVE:   CHIEF COMPLAINT / HPI:   Patient presents for annual physical, denies any concerns today. Diagnosed with autism in middle school. Eats a balanced diet. Stays active by walking the dogs.  Denies alcohol or tobacco use. Denies IV or other drug use. Lives with mother and brother. Does not attend school or go to work. Enjoys writing.   OBJECTIVE:   BP 130/70   Pulse 77   Ht 5\' 8"  (1.727 m)   Wt 149 lb (67.6 kg)   SpO2 98%   BMI 22.66 kg/m   General: Patient well-appearing, in no acute distress. HEENT: PERRLA, normal buccal mucosa, non-tender thyroid CV: RRR, no murmurs or gallops auscultated Resp: CTAB, no wheezing, rales or rhonchi noted Abdomen: soft, nontender, nondistended, presence of bowel sounds Ext: no LE edema noted bilaterally Neuro: normal gait   ASSESSMENT/PLAN:   Annual physical exam -doing well -discussed the importance of lifestyle modifications, handout provided. Emphasized the importance of continued balanced diet. Encouraged the importance of staying active. -Med rec reviewed -PHQ-9 score of 5 with negative question 9 reviewed.  -Tdap administered today, patient tolerated well without complications -follow up in 1 year or sooner as appropriate     Gwendolin Briel Robyne Peers, DO [facility]

## 2022-11-21 NOTE — Patient Instructions (Signed)
It was great seeing you today!  Today we discussed the importance of continuing to eat a healthy, balanced diet. I encourage you to continue to stay active daily to get your heart pumping. This decreases the risk of heart disease in the future.   You will receive your tetanus vaccine today as well!  Please follow up at your next scheduled appointment in 1 year, if anything arises between now and then, please don't hesitate to contact our office.   Thank you for allowing [place] to be a part of your medical care!  Thank you, Dr. Robyne Peers

## 2022-12-04 ENCOUNTER — Telehealth: Payer: Self-pay | Admitting: Student

## 2022-12-04 NOTE — Telephone Encounter (Signed)
Contacted Myrtie Cruise to schedule their annual wellness visit. Welcome to Medicare visit Due by 08/30/2023.  Thank you,  [facility] Direct dial  [phone]

## 2023-01-01 ENCOUNTER — Other Ambulatory Visit: Payer: Self-pay

## 2023-01-01 ENCOUNTER — Encounter (HOSPITAL_COMMUNITY): Payer: Self-pay | Admitting: Emergency Medicine

## 2023-01-01 ENCOUNTER — Ambulatory Visit (HOSPITAL_COMMUNITY)
Admission: EM | Admit: 2023-01-01 | Discharge: 2023-01-01 | Disposition: A | Discharge status: Home / Self Care | Payer: Medicare Other | Attending: Family Medicine | Admitting: Family Medicine

## 2023-01-01 DIAGNOSIS — L255 Unspecified contact dermatitis due to plants, except food: Secondary | ICD-10-CM

## 2023-01-01 MED ORDER — PREDNISONE 10 MG (48) PO TBPK: ORAL_TABLET | ORAL | 0 refills | Status: DC

## 2023-01-01 NOTE — ED Triage Notes (Signed)
Rash noticed Friday.  Areas involved is right torso, right wrist and right side of neck.    Has not used any medications

## 2023-01-02 NOTE — ED Provider Notes (Signed)
  [facility]   [id number] 01/01/23 Arrival Time: 1806  ASSESSMENT & PLAN:  1. Rhus dermatitis    Begin: Meds ordered this encounter  Medications   predniSONE (STERAPRED UNI-PAK 48 TAB) 10 MG (48) TBPK tablet    Sig: Take as directed.    Dispense:  48 tablet    Refill:  0   No signs of skin infection.  Will follow up with PCP or here if worsening or failing to improve as anticipated. Reviewed expectations re: course of current medical issues. Questions answered. Outlined signs and symptoms indicating need for more acute intervention. Patient verbalized understanding. After Visit Summary given.   SUBJECTIVE:  Kenneth Foley is a 22 y.o. male who presents with a skin complaint. Rash noticed Friday.  Areas involved is right torso, right wrist and right side of neck.    Has not used any medications   OBJECTIVE: Vitals:   01/01/23 1822  BP: 122/71  Pulse: 60  Resp: 16  Temp: 98.8 F (37.1 C)  TempSrc: Oral  SpO2: 97%    General appearance: alert; no distress HEENT: [place]; AT Neck: supple with FROM Lungs: clear to auscultation bilaterally Heart: regular rate and rhythm Extremities: no edema; moves all extremities normally Skin: warm and dry; signs of infection: no; areas of linear papules and vesicles with surrounding erythema over neck, abdomen, and R forearm Psychological: alert and cooperative; normal mood and affect  No Known Allergies  History reviewed. No pertinent past medical history. Social History   Socioeconomic History   Marital status: Single    Spouse name: Not on file   Number of children: Not on file   Years of education: Not on file   Highest education level: Not on file  Occupational History   Not on file  Tobacco Use   Smoking status: Never   Smokeless tobacco: Never  Vaping Use   Vaping Use: Never used  Substance and Sexual Activity   Alcohol use: Not Currently   Drug use: Never   Sexual activity: Not Currently  Other  Topics Concern   Not on file  Social History Narrative   Not on file   Social Determinants of Health   Financial Resource Strain: Not on file  Food Insecurity: Not on file  Transportation Needs: Not on file  Physical Activity: Not on file  Stress: Not on file  Social Connections: Not on file  Intimate Partner Violence: Not on file   History reviewed. No pertinent family history. History reviewed. No pertinent surgical history.    Mardella Layman, MD 01/02/23 [phone]

## 2023-05-07 ENCOUNTER — Ambulatory Visit (HOSPITAL_COMMUNITY)
Admission: EM | Admit: 2023-05-07 | Discharge: 2023-05-07 | Disposition: A | Discharge status: Home / Self Care | Payer: Medicare Other | Attending: Physician Assistant | Admitting: Physician Assistant

## 2023-05-07 ENCOUNTER — Encounter (HOSPITAL_COMMUNITY): Payer: Self-pay | Admitting: Emergency Medicine

## 2023-05-07 DIAGNOSIS — G44209 Tension-type headache, unspecified, not intractable: Secondary | ICD-10-CM | POA: Diagnosis not present

## 2023-05-07 MED ORDER — KETOROLAC TROMETHAMINE 30 MG/ML IJ SOLN
INTRAMUSCULAR | Status: AC
  Filled 2023-05-07: qty 1

## 2023-05-07 MED ORDER — ONDANSETRON 4 MG PO TBDP: 4.0000 mg | ORAL_TABLET | Freq: Three times a day (TID) | ORAL | 0 refills | Status: DC | PRN

## 2023-05-07 MED ORDER — ONDANSETRON 4 MG PO TBDP
ORAL_TABLET | ORAL | Status: AC
  Filled 2023-05-07: qty 1

## 2023-05-07 MED ORDER — DEXAMETHASONE SODIUM PHOSPHATE 10 MG/ML IJ SOLN
10.0000 mg | Freq: Once | INTRAMUSCULAR | Status: AC
  Administered 2023-05-07: 10 mg via INTRAMUSCULAR

## 2023-05-07 MED ORDER — ONDANSETRON 4 MG PO TBDP
4.0000 mg | ORAL_TABLET | Freq: Once | ORAL | Status: AC
  Administered 2023-05-07: 4 mg via ORAL

## 2023-05-07 MED ORDER — BACLOFEN 10 MG PO TABS: 10.0000 mg | ORAL_TABLET | Freq: Every evening | ORAL | 0 refills | Status: DC | PRN

## 2023-05-07 MED ORDER — KETOROLAC TROMETHAMINE 30 MG/ML IJ SOLN
30.0000 mg | Freq: Once | INTRAMUSCULAR | Status: AC
  Administered 2023-05-07: 30 mg via INTRAMUSCULAR

## 2023-05-07 MED ORDER — DEXAMETHASONE SODIUM PHOSPHATE 10 MG/ML IJ SOLN
INTRAMUSCULAR | Status: AC
  Filled 2023-05-07: qty 1

## 2023-05-07 NOTE — ED Provider Notes (Signed)
[facility]    CSN: [id number] Arrival date & time: 05/07/23  1517      History   Chief Complaint Chief Complaint  Patient presents with   Headache    HPI Kenneth Foley is a 22 y.o. male.   Patient presents today with a year-long history of intermittent headaches that have become more frequent and severe over the past 3 months.  Denies formal diagnosis of primary headache disorder including migraine.  He reports that during episodes pain is localized to 1 or both temples and around his eye, described as throbbing, worse with activity, no alleviating factors identified.  He denies any vision change or sensation change prior to symptom onset consistent with an aura.  He has never had any kind of head imaging.  Denies any recent head injury or medication changes.  He has not tried any over-the-counter medicine for symptom management.  He is accompanied by a caregiver who states that he is often on the computer or his cell phone and does find that this triggers his symptoms.  He also has difficulty sleeping at night and when he sleep deprived he is more likely to get headache.  Denies any recent illness or additional symptoms including cough, congestion, neck pain, fever.  This is not the worst headache of his life.  Current headache is rated 8 on a 0-10 pain scale, described as throbbing, no alleviating factors identified.    History reviewed. No pertinent past medical history.  Patient Active Problem List   Diagnosis Date Noted   Annual physical exam 10/12/2019   Poor diet 05/27/2018   Family history of diabetes mellitus type II 05/27/2018   Family history of high cholesterol 05/27/2018   Difficulty sleeping 03/24/2018   Learning disability:  Nonverbal Composite DAS:  117 08/23/2014   Autism spectrum disorder 09/23/2013    History reviewed. No pertinent surgical history.     Home Medications    Prior to Admission medications   Medication Sig Start Date End Date  Taking? Authorizing Provider  baclofen (LIORESAL) 10 MG tablet Take 1 tablet (10 mg total) by mouth at bedtime as needed for muscle spasms. 05/07/23  Yes Betzabeth Derringer K, PA-C  ondansetron (ZOFRAN-ODT) 4 MG disintegrating tablet Take 1 tablet (4 mg total) by mouth every 8 (eight) hours as needed for nausea or vomiting. 05/07/23  Yes Tosh Glaze, Noberto Retort, PA-C    Family History History reviewed. No pertinent family history.  Social History Social History   Tobacco Use   Smoking status: Never   Smokeless tobacco: Never  Vaping Use   Vaping status: Never Used  Substance Use Topics   Alcohol use: Not Currently   Drug use: Never     Allergies   Patient has no known allergies.   Review of Systems Review of Systems  Constitutional:  Positive for activity change. Negative for appetite change, fatigue and fever.  HENT:  Negative for congestion.   Eyes:  Positive for photophobia. Negative for visual disturbance.  Respiratory:  Negative for cough and shortness of breath.   Cardiovascular:  Negative for chest pain.  Gastrointestinal:  Positive for nausea. Negative for abdominal pain, diarrhea and vomiting.  Musculoskeletal:  Negative for arthralgias and myalgias.  Neurological:  Positive for headaches. Negative for dizziness, seizures, syncope, facial asymmetry, speech difficulty, weakness, light-headedness and numbness.     Physical Exam Triage Vital Signs ED Triage Vitals  Encounter Vitals Group     BP 05/07/23 1527 117/71  Systolic BP Percentile --      Diastolic BP Percentile --      Pulse Rate 05/07/23 1527 81     Resp 05/07/23 1527 17     Temp 05/07/23 1527 98.2 F (36.8 C)     Temp Source 05/07/23 1527 Oral     SpO2 05/07/23 1527 95 %     Weight --      Height --      Head Circumference --      Peak Flow --      Pain Score 05/07/23 1533 8     Pain Loc --      Pain Education --      Exclude from Growth Chart --    No data found.  Updated Vital Signs BP 117/71 (BP  Location: Right Arm)   Pulse 81   Temp 98.2 F (36.8 C) (Oral)   Resp 17   SpO2 95%   Visual Acuity Right Eye Distance:   Left Eye Distance:   Bilateral Distance:    Right Eye Near:   Left Eye Near:    Bilateral Near:     Physical Exam Vitals reviewed.  Constitutional:      General: He is awake.     Appearance: Normal appearance. He is well-developed. He is not ill-appearing.     Comments: Very pleasant male appears stated age in no acute distress sitting comfortably in exam room  HENT:     Head: Normocephalic and atraumatic.     Right Ear: Tympanic membrane, ear canal and external ear normal. No hemotympanum.     Left Ear: Tympanic membrane, ear canal and external ear normal. No hemotympanum.     Nose: Nose normal.     Mouth/Throat:     Pharynx: Uvula midline. No oropharyngeal exudate or posterior oropharyngeal erythema.  Eyes:     Extraocular Movements: Extraocular movements intact.     Conjunctiva/sclera: Conjunctivae normal.     Pupils: Pupils are equal, round, and reactive to light.  Cardiovascular:     Rate and Rhythm: Normal rate and regular rhythm.     Heart sounds: Normal heart sounds, S1 normal and S2 normal. No murmur heard. Pulmonary:     Effort: Pulmonary effort is normal. No accessory muscle usage or respiratory distress.     Breath sounds: Normal breath sounds. No stridor. No wheezing, rhonchi or rales.     Comments: Clear to auscultation bilaterally Abdominal:     Palpations: Abdomen is soft.     Tenderness: There is no abdominal tenderness.  Musculoskeletal:     Cervical back: Normal range of motion and neck supple.     Comments: Strength 5/5 bilateral upper and lower extremities  Neurological:     General: No focal deficit present.     Mental Status: He is alert and oriented to person, place, and time.     Cranial Nerves: Cranial nerves 2-12 are intact.     Motor: Motor function is intact.     Coordination: Coordination is intact. Romberg sign  negative. Rapid alternating movements normal.     Gait: Gait is intact.     Comments: Cranial nerves II through XII grossly intact.  No focal neurological defect on exam.  Psychiatric:        Behavior: Behavior is cooperative.      UC Treatments / Results  Labs (all labs ordered are listed, but only abnormal results are displayed) Labs Reviewed - No data to display  EKG  Radiology No results found.  Procedures Procedures (including critical care time)  Medications Ordered in UC Medications  ondansetron (ZOFRAN-ODT) disintegrating tablet 4 mg (4 mg Oral Given 05/07/23 1606)  ketorolac (TORADOL) 30 MG/ML injection 30 mg (30 mg Intramuscular Given 05/07/23 1607)  dexamethasone (DECADRON) injection 10 mg (10 mg Intramuscular Given 05/07/23 1607)    Initial Impression / Assessment and Plan / UC Course  I have reviewed the triage vital signs and the nursing notes.  Pertinent labs & imaging results that were available during my care of the patient were reviewed by me and considered in my medical decision making (see chart for details).     Patient is well-appearing, afebrile, nontoxic, nontachycardic.  Vital signs and physical exam are reassuring with no indication for emergent evaluation or imaging.  Patient is given Toradol and Decadron with improvement of symptoms.  Pain went from 8 to 2/3 on a 0-10 pain scale.  He was instructed to avoid NSAIDs for the next 24 hours given Toradol injection today but can use acetaminophen/Tylenol as needed.  He was also given Zofran in clinic which resolved nausea and so this was sent to the pharmacy to be used every 8 hours as needed.  Recommended that he eat small frequent meals and drink plenty of fluid.  Given his recurrent symptoms I did recommend he follow-up with his primary care to determine if referral to neurology for preventative medication or additional workup is appropriate.  We discussed headache prevention strategies including avoiding  prolonged exposure to bluelight, drinking plenty of fluid, decreasing stressors.  We discussed that if anything worsens and he has severe headache, worsening of his life, nausea/vomiting despite antiemetic medication, visual disturbance, weakness he needs to go to the emergency room.  Strict return precautions given.  Work excuse note provided.  Final Clinical Impressions(s) / UC Diagnoses   Final diagnoses:  Tension headache     Discharge Instructions      I am glad that you are feeling better after the medication.  Because we gave you the injection of Toradol today, please do not take any NSAIDs including aspirin, ibuprofen/Advil, naproxen/Aleve for 24 hours.  You can use acetaminophen/Tylenol as needed.  Use Zofran every 8 hours as needed for nausea and vomiting.  Eat small frequent meals and drink plenty of fluid.  Avoid prolonged use of screens as bluelight can exacerbate symptoms.  Take baclofen at night.  This will make you sleepy so do not drive or drink alcohol while taking it.  Follow-up with your primary care next week.  If anything worsens and you have severe headache, the worst headache of your life, nausea/vomiting despite medication, vision change, weakness, trouble speaking, confusion you need to go to the emergency room.     ED Prescriptions     Medication Sig Dispense Auth. Provider   baclofen (LIORESAL) 10 MG tablet Take 1 tablet (10 mg total) by mouth at bedtime as needed for muscle spasms. 7 each Damascus Feldpausch K, PA-C   ondansetron (ZOFRAN-ODT) 4 MG disintegrating tablet Take 1 tablet (4 mg total) by mouth every 8 (eight) hours as needed for nausea or vomiting. 20 tablet Sinead Hockman, Noberto Retort, PA-C      PDMP not reviewed this encounter.   Jeani Hawking, PA-C 05/07/23 1637

## 2023-05-07 NOTE — ED Triage Notes (Addendum)
Pt c/o headache that he has been experiencing for 1 year. States it is usually in the morning on one side and feels like pressure behind eye. Pt also states it affects his thinking.  States he has difficulty sleeping most nights.

## 2023-05-07 NOTE — Discharge Instructions (Addendum)
I am glad that you are feeling better after the medication.  Because we gave you the injection of Toradol today, please do not take any NSAIDs including aspirin, ibuprofen/Advil, naproxen/Aleve for 24 hours.  You can use acetaminophen/Tylenol as needed.  Use Zofran every 8 hours as needed for nausea and vomiting.  Eat small frequent meals and drink plenty of fluid.  Avoid prolonged use of screens as bluelight can exacerbate symptoms.  Take baclofen at night.  This will make you sleepy so do not drive or drink alcohol while taking it.  Follow-up with your primary care next week.  If anything worsens and you have severe headache, the worst headache of your life, nausea/vomiting despite medication, vision change, weakness, trouble speaking, confusion you need to go to the emergency room.

## 2023-06-02 ENCOUNTER — Encounter (HOSPITAL_COMMUNITY): Payer: Self-pay

## 2023-06-02 ENCOUNTER — Ambulatory Visit (HOSPITAL_COMMUNITY)
Admission: EM | Admit: 2023-06-02 | Discharge: 2023-06-02 | Disposition: A | Discharge status: Home / Self Care | Payer: Medicare Other | Attending: Family Medicine | Admitting: Family Medicine

## 2023-06-02 DIAGNOSIS — M25532 Pain in left wrist: Secondary | ICD-10-CM | POA: Diagnosis not present

## 2023-06-02 NOTE — Discharge Instructions (Addendum)
Please use ibuprofen as needed for your pain  If you feel like the pain is still occurring despite using a Profen, you can go ahead and use response.

## 2023-06-02 NOTE — ED Triage Notes (Signed)
Patient here today with c/o left wrist pain that started last week. No known injury. Patient states that the pain worsens with pushing and lifting. The pain comes and goes. No pain currently.

## 2023-06-02 NOTE — ED Provider Notes (Signed)
[facility]    CSN: [id number] Arrival date & time: 06/02/23  1028      History   Chief Complaint Chief Complaint  Patient presents with   Wrist Pain    HPI CARMINO ZIRKELBACH is a 22 y.o. male.   Patient is presenting with some left-sided wrist pain on the dorsal side.  Patient states that the pain has been for the past few days however patient states that there is no pain this morning.  Patient is coming by his mom who states that patient was helping her move boxes and he was complaining of pain.  Patient states that the pain occurred whenever he used his wrist but states that he has no pain today.  No other concerns at this time.   Wrist Pain    History reviewed. No pertinent past medical history.  Patient Active Problem List   Diagnosis Date Noted   Annual physical exam 10/12/2019   Poor diet 05/27/2018   Family history of diabetes mellitus type II 05/27/2018   Family history of high cholesterol 05/27/2018   Difficulty sleeping 03/24/2018   Learning disability:  Nonverbal Composite DAS:  117 08/23/2014   Autism spectrum disorder 09/23/2013    History reviewed. No pertinent surgical history.     Home Medications    Prior to Admission medications   Not on File    Family History History reviewed. No pertinent family history.  Social History Social History   Tobacco Use   Smoking status: Never   Smokeless tobacco: Never  Vaping Use   Vaping status: Never Used  Substance Use Topics   Alcohol use: Not Currently   Drug use: Never     Allergies   Patient has no known allergies.   Review of Systems Review of Systems   Physical Exam Triage Vital Signs ED Triage Vitals  Encounter Vitals Group     BP 06/02/23 1127 121/67     Systolic BP Percentile --      Diastolic BP Percentile --      Pulse Rate 06/02/23 1127 68     Resp 06/02/23 1127 16     Temp 06/02/23 1127 99 F (37.2 C)     Temp Source 06/02/23 1127 Oral     SpO2 06/02/23  1127 96 %     Weight 06/02/23 1128 140 lb (63.5 kg)     Height 06/02/23 1128 5\' 8"  (1.727 m)     Head Circumference --      Peak Flow --      Pain Score 06/02/23 1126 0     Pain Loc --      Pain Education --      Exclude from Growth Chart --    No data found.  Updated Vital Signs BP 121/67 (BP Location: Left Arm)   Pulse 68   Temp 99 F (37.2 C) (Oral)   Resp 16   Ht 5\' 8"  (1.727 m)   Wt 63.5 kg   SpO2 96%   BMI 21.29 kg/m   Visual Acuity Right Eye Distance:   Left Eye Distance:   Bilateral Distance:    Right Eye Near:   Left Eye Near:    Bilateral Near:     Physical Exam  Inspection of the left wrist is negative for any acute abnormalities, there is no tenderness to palpation on the dorsal or volar side.  No tenderness over extensor tendons themselves.  Range of motion is full without any pain with  both extension and flexion.  Strength is 5 out of 5. UC Treatments / Results  Labs (all labs ordered are listed, but only abnormal results are displayed) Labs Reviewed - No data to display  EKG   Radiology No results found.  Procedures Procedures (including critical care time)  Medications Ordered in UC Medications - No data to display  Initial Impression / Assessment and Plan / UC Course  I have reviewed the triage vital signs and the nursing notes.  Pertinent labs & imaging results that were available during my care of the patient were reviewed by me and considered in my medical decision making (see chart for details).     Given the patient's symptoms are improved, no other concerns at this time for any acute pathology.  Patient likely dealing with an extensor strain, patient was advised that if this pain comes back she can take ibuprofen as needed for the pain.  Also advised patient that if the pain persist despite ibuprofen use can always use with splints while doing any sort of activity.  Patient and mother are understanding and agreeable with plan. Final  Clinical Impressions(s) / UC Diagnoses   Final diagnoses:  Left wrist pain     Discharge Instructions      Please use ibuprofen as needed for your pain  If you feel like the pain is still occurring despite using a Profen, you can go ahead and use response.     ED Prescriptions   None    PDMP not reviewed this encounter.   Brenton Grills, MD 06/02/23 1150

## 2023-12-08 ENCOUNTER — Encounter: Admitting: Student

## 2024-03-15 ENCOUNTER — Ambulatory Visit (INDEPENDENT_AMBULATORY_CARE_PROVIDER_SITE_OTHER)

## 2024-03-15 VITALS — Ht 68.0 in | Wt 140.0 lb

## 2024-03-15 DIAGNOSIS — Z0000 Encounter for general adult medical examination without abnormal findings: Secondary | ICD-10-CM

## 2024-03-15 NOTE — Patient Instructions (Signed)
 [name],  Thank you for taking the time for your Medicare Wellness Visit. I appreciate your continued commitment to your health goals. Please review the care plan we discussed, and feel free to reach out if I can assist you further.  Medicare recommends these wellness visits once per year to help you and your care team stay ahead of potential health issues. These visits are designed to focus on prevention, allowing your provider to concentrate on managing your acute and chronic conditions during your regular appointments.  Please note that Annual Wellness Visits do not include a physical exam. Some assessments may be limited, especially if the visit was conducted virtually. If needed, we may recommend a separate in-person follow-up with your provider.  Ongoing Care Seeing your primary care provider every 3 to 6 months helps us  monitor your health and provide consistent, personalized care.   Referrals If a referral was made during today's visit and you haven't received any updates within two weeks, please contact the referred provider directly to check on the status.  Recommended Screenings:  Health Maintenance  Topic Date Due   Flu Shot  01/30/2024   COVID-19 Vaccine (1 - 2024-25 season) Never done   Medicare Annual Wellness Visit  03/15/2025   DTaP/Tdap/Td vaccine (9 - Td or Tdap) 11/20/2032   Hepatitis B Vaccine  Completed   HPV Vaccine  Completed   Hepatitis C Screening  Completed   HIV Screening  Completed   Pneumococcal Vaccine  Aged Out   Meningitis B Vaccine  Aged Out       03/15/2024    9:27 AM  Advanced Directives  Does Patient Have a Medical Advance Directive? No  Would patient like information on creating a medical advance directive? No - Patient declined   Advance Care Planning is important because it: Ensures you receive medical care that aligns with your values, goals, and preferences. Provides guidance to your family and loved ones, reducing the emotional  burden of decision-making during critical moments.  Vision: Annual vision screenings are recommended for early detection of glaucoma, cataracts, and diabetic retinopathy. These exams can also reveal signs of chronic conditions such as diabetes and high blood pressure.  Dental: Annual dental screenings help detect early signs of oral cancer, gum disease, and other conditions linked to overall health, including heart disease and diabetes.  Please see the attached documents for additional preventive care recommendations.

## 2024-03-15 NOTE — Progress Notes (Addendum)
 Because this visit was a virtual/telehealth visit,  certain criteria was not obtained, such a blood pressure, CBG if applicable, and timed get up and go. Any medications not marked as taking were not mentioned during the medication reconciliation part of the visit. Any vitals not documented were not able to be obtained due to this being a telehealth visit or patient was unable to self-report a recent blood pressure reading due to a lack of equipment at home via telehealth. Vitals that have been documented are verbally provided by the patient.   Subjective:   Kenneth Foley is a 23 y.o. who presents for a Medicare Wellness preventive visit.  As a reminder, Annual Wellness Visits don't include a physical exam, and some assessments may be limited, especially if this visit is performed virtually. We may recommend an in-person follow-up visit with your provider if needed.  Visit Complete: Virtual I connected with  Kenneth Foley on 03/15/24 by a audio enabled telemedicine application and verified that I am speaking with the correct person using two identifiers.  Patient Location: Other:  Outside walking  Provider Location: Home Office  I discussed the limitations of evaluation and management by telemedicine. The patient expressed understanding and agreed to proceed.  Vital Signs: Because this visit was a virtual/telehealth visit, some criteria may be missing or patient reported. Any vitals not documented were not able to be obtained and vitals that have been documented are patient reported.  VideoDeclined- This patient declined Librarian, academic. Therefore the visit was completed with audio only.  Persons Participating in Visit: Patient.  AWV Questionnaire: No: Patient Medicare AWV questionnaire was not completed prior to this visit.  Cardiac Risk Factors include: none     Objective:    Today's Vitals   03/15/24 0926  Weight: 140 lb (63.5 kg)  Height:  5' 8 (1.727 m)  PainSc: 0-No pain   Body mass index is 21.29 kg/m.     03/15/2024    9:27 AM 11/21/2022    8:30 AM 11/14/2020    1:47 PM 03/23/2018    9:17 AM  Advanced Directives  Does Patient Have a Medical Advance Directive? No No No No   Would patient like information on creating a medical advance directive? No - Patient declined No - Patient declined No - Patient declined No - Patient declined      Data saved with a previous flowsheet row definition    Current Medications (verified) No outpatient encounter medications on file as of 03/15/2024.   No facility-administered encounter medications on file as of 03/15/2024.    Allergies (verified) Patient has no known allergies.   History: History reviewed. No pertinent past medical history. History reviewed. No pertinent surgical history. History reviewed. No pertinent family history. Social History   Socioeconomic History   Marital status: Single    Spouse name: Not on file   Number of children: Not on file   Years of education: Not on file   Highest education level: Not on file  Occupational History   Not on file  Tobacco Use   Smoking status: Never   Smokeless tobacco: Never  Vaping Use   Vaping status: Never Used  Substance and Sexual Activity   Alcohol use: Not Currently   Drug use: Never   Sexual activity: Not Currently  Other Topics Concern   Not on file  Social History Narrative   Not on file   Social Drivers of Health   Financial Resource Strain:  Low Risk  (03/15/2024)   Overall Financial Resource Strain (CARDIA)    Difficulty of Paying Living Expenses: Not hard at all  Food Insecurity: No Food Insecurity (03/15/2024)   Hunger Vital Sign    Worried About Running Out of Food in the Last Year: Never true    Ran Out of Food in the Last Year: Never true  Transportation Needs: No Transportation Needs (03/15/2024)   PRAPARE - Administrator, Civil Service (Medical): No    Lack of Transportation  (Non-Medical): No  Physical Activity: Sufficiently Active (03/15/2024)   Exercise Vital Sign    Days of Exercise per Week: 5 days    Minutes of Exercise per Session: 30 min  Stress: No Stress Concern Present (03/15/2024)   [employer] of Occupational Health - Occupational Stress Questionnaire    Feeling of Stress: Not at all  Social Connections: Patient Declined (03/15/2024)   Social Connection and Isolation Panel    Frequency of Communication with Friends and Family: Patient declined    Frequency of Social Gatherings with Friends and Family: Patient declined    Attends Religious Services: Patient declined    Database administrator or Organizations: Patient declined    Attends Engineer, structural: Patient declined    Marital Status: Patient declined    Tobacco Counseling Counseling given: Not Answered    Clinical Intake:  Pre-visit preparation completed: Yes  Pain : No/denies pain Pain Score: 0-No pain     BMI - recorded: 21.29 Nutritional Status: BMI of 19-24  Normal Nutritional Risks: None Diabetes: No  No results found for: HGBA1C   How often do you need to have someone help you when you read instructions, pamphlets, or other written materials from your doctor or pharmacy?: 1 - Never What is the last grade level you completed in school?: N/A  Interpreter Needed?: No  Information entered by :: Jerika Wales N. Mandie Crabbe, LPN.   Activities of Daily Living     03/15/2024    9:29 AM  In your present state of health, do you have any difficulty performing the following activities:  Hearing? 0  Vision? 0  Difficulty concentrating or making decisions? 0  Walking or climbing stairs? 0  Dressing or bathing? 0  Doing errands, shopping? 0  Preparing Food and eating ? N  Using the Toilet? N  In the past six months, have you accidently leaked urine? N  Do you have problems with loss of bowel control? N  Managing your Medications? N  Managing your Finances? N   Housekeeping or managing your Housekeeping? N    Patient Care Team: Howell Lunger, DO as PCP - General (Family Medicine)  I have updated your Care Teams any recent Medical Services you may have received from other providers in the past year.     Assessment:   This is a routine wellness examination for Kenneth Foley.  Hearing/Vision screen Hearing Screening - Comments:: Patient has adequate hearing. Vision Screening - Comments:: Patient has adequate vision.    Goals Addressed             This Visit's Progress    Patient Stated       None at this time.       Depression Screen     03/15/2024    9:29 AM 11/21/2022    8:36 AM 11/20/2021    4:32 PM 03/23/2018    9:15 AM  PHQ 2/9 Scores  PHQ - 2 Score 0 1 1 0  PHQ- 9 Score 1 5 4      Fall Risk     03/15/2024    9:28 AM  Fall Risk   Falls in the past year? 0  Number falls in past yr: 0  Injury with Fall? 0  Risk for fall due to : No Fall Risks  Follow up Falls evaluation completed    MEDICARE RISK AT HOME:  Medicare Risk at Home Any stairs in or around the home?: No If so, are there any without handrails?: No Home free of loose throw rugs in walkways, pet beds, electrical cords, etc?: Yes Adequate lighting in your home to reduce risk of falls?: Yes Life alert?: No Use of a cane, walker or w/c?: No Grab bars in the bathroom?: No Shower chair or bench in shower?: No Elevated toilet seat or a handicapped toilet?: No  TIMED UP AND GO:  Was the test performed?  No  Cognitive Function: Declined/Normal: No cognitive concerns noted by patient or family. Patient alert, oriented, able to answer questions appropriately and recall recent events. No signs of memory loss or confusion.    03/15/2024    9:29 AM  MMSE - Mini Mental State Exam  Not completed: Unable to complete         Immunizations Immunization History  Administered Date(s) Administered   DTaP 01/15/2000, 03/14/2000, 05/16/2000, 05/14/2001, 11/16/2003    DTaP / HiB / IPV 01/15/2000, 03/14/2000, 05/16/2000, 05/14/2001, 11/16/2003   H1N1 06/08/2008   HIB (PRP-OMP) 01/15/2000, 03/14/2000, 05/16/2000, 11/10/2000   HPV Quadrivalent 01/01/2011, 03/12/2011, 01/09/2012   Hepatitis A 11/20/2005, 11/30/2007   Hepatitis B Jun 17, 2000, 12/14/1999, 08/13/2000   IPV 01/15/2000, 03/14/2000, 05/16/2000, 11/16/2003   Influenza Nasal 04/24/2012, 03/30/2013   Influenza Split 04/19/2010   Influenza,inj,Quad PF,6+ Mos 03/19/2018   MMR 11/10/2000, 11/16/2003   Meningococcal Conjugate 01/01/2011   Pneumococcal Conjugate-13 01/15/2000, 03/14/2000, 08/13/2000, 11/19/2004   Td 12/28/2009   Tdap 12/28/2009, 11/21/2022   Varicella 11/10/2000, 11/20/2005    Screening Tests Health Maintenance  Topic Date Due   Influenza Vaccine  01/30/2024   COVID-19 Vaccine (1 - 2024-25 season) Never done   Medicare Annual Wellness (AWV)  03/15/2025   DTaP/Tdap/Td (9 - Td or Tdap) 11/20/2032   Hepatitis B Vaccines 19-59 Average Risk  Completed   HPV VACCINES  Completed   Hepatitis C Screening  Completed   HIV Screening  Completed   Pneumococcal Vaccine  Aged Out   Meningococcal B Vaccine  Aged Out    Health Maintenance Items Addressed: Yes, patient aware of current care gaps.  Patient is due for Flu and Covid vaccines.  Additional Screening:  Vision Screening: Recommended annual ophthalmology exams for early detection of glaucoma and other disorders of the eye. Is the patient up to date with their annual eye exam?  No  Who is the provider or what is the name of the office in which the patient attends annual eye exams? N/A  Dental Screening: Recommended annual dental exams for proper oral hygiene  Community Resource Referral / Chronic Care Management: CRR required this visit?  No   CCM required this visit?  No   Plan:    I have personally reviewed and noted the following in the patient's chart:   Medical and social history Use of alcohol, tobacco or  illicit drugs  Current medications and supplements including opioid prescriptions. Patient is not currently taking opioid prescriptions. Functional ability and status Nutritional status Physical activity Advanced directives List of other physicians Hospitalizations, surgeries, and ER  visits in previous 12 months Vitals Screenings to include cognitive, depression, and falls Referrals and appointments  In addition, I have reviewed and discussed with patient certain preventive protocols, quality metrics, and best practice recommendations. A written personalized care plan for preventive services as well as general preventive health recommendations were provided to patient.   Roz LOISE Fuller, LPN   [id number]   After Visit Summary: (Declined) Due to this being a telephonic visit, with patients personalized plan was offered to patient but patient Declined AVS at this time   Notes: Nothing significant to report at this time.SABRA

## 2024-04-06 ENCOUNTER — Ambulatory Visit: Admitting: Student

## 2024-04-19 ENCOUNTER — Ambulatory Visit: Admitting: Student

## 2024-04-27 ENCOUNTER — Ambulatory Visit (HOSPITAL_COMMUNITY)
Admission: EM | Admit: 2024-04-27 | Discharge: 2024-04-27 | Disposition: A | Discharge status: Home / Self Care | Attending: Behavioral Health | Admitting: Behavioral Health

## 2024-04-27 DIAGNOSIS — Z8659 Personal history of other mental and behavioral disorders: Secondary | ICD-10-CM

## 2024-04-27 DIAGNOSIS — Z638 Other specified problems related to primary support group: Secondary | ICD-10-CM | POA: Diagnosis not present

## 2024-04-27 DIAGNOSIS — F840 Autistic disorder: Secondary | ICD-10-CM | POA: Insufficient documentation

## 2024-04-27 DIAGNOSIS — F32A Depression, unspecified: Secondary | ICD-10-CM | POA: Diagnosis present

## 2024-04-27 DIAGNOSIS — Z634 Disappearance and death of family member: Secondary | ICD-10-CM | POA: Diagnosis not present

## 2024-04-27 DIAGNOSIS — Z560 Unemployment, unspecified: Secondary | ICD-10-CM | POA: Insufficient documentation
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
## 2024-04-27 NOTE — Discharge Instructions (Addendum)
 Discharge recommendations:   Outpatient Follow up: Please review list of outpatient resources for psychiatry and counseling. Please follow up with your primary care provider for all medical related needs.   Outpatient Therapy and Psychiatry for Medicare Recipients  [facility] [address] [place], [place], [address] [phone] phone  [facility] [address] [place], [place], [address] [address],5TH FLOOR phone ([address] [employer] [address] - [place], [address], [place], [place], [employer], [address], [employer] [facility] [place], [place], [address], [place], [place], [place], [employer], [employer], [employer], [employer], [place])  [facility] [address], [address] [place], [place], [address] [phone] phone  [facility] [address], [address] [place], [place], [address] [phone] phone  [facility] [address] [place], [place], [address] [phone] phone [phone] fax  [facility] [place] [address], [place], [address] [phone] phone  [facility] [address] [place], [place], [address] [phone] phone [phone] fax  [facility] [address] [place], [place], [address] [phone] phone  Jan[facility] Griffins [address] [place], [place], [address] [phone] phone  [facility] [address] [place], [place], [address] [phone] phone [phone] fax  Therapy: We recommend that patient participate in individual therapy to address mental health concerns.  Safety:   The following safety precautions should be taken:   No sharp objects. This includes scissors, razors, scrapers, and putty knives.   Chemicals should be removed and locked up.   Medications should be removed and locked up.   Weapons should be removed and locked up. This includes  firearms, knives and instruments that can be used to cause injury.   The patient should abstain from use of illicit substances/drugs and abuse of any medications.  If symptoms worsen or do not continue to improve or if the patient becomes actively suicidal or homicidal then it is recommended that the patient return to the closest hospital emergency department, the [facility], or call 911 for further evaluation and treatment. National Suicide Prevention Lifeline 1-800-SUICIDE or 413-012-5371.  About 988 988 offers 24/7 access to trained crisis counselors who can help people experiencing mental health-related distress. People can call or text 988 or chat 988lifeline.org for themselves or if they are worried about a loved one who may need crisis support.

## 2024-04-27 NOTE — BH Assessment (Signed)
 Clinician contacted patient's mother, Bartholome [phone]) with his verbal consent.  She denied any safety concerns, and she states there are no weapons in the home.  She is informed patient has requested counseling referrals and will have resources provided in his discharge paperwork.  She engaged in safety planning and asked how patient would get home.  Patient's mother informed that patient will return via BHRT.

## 2024-04-27 NOTE — ED Notes (Signed)
 Patient A&O x 4, ambulatory. Patient discharged in no acute distress. Patient denied SI/HI, A/VH upon discharge. Patient verbalized understanding of all discharge instructions reviewed on AVS via staff, to include follow up follow up resources for individual therapy and safety. Pt belongings returned to patient from outside locker intact. BHRT called to transport pt back to home. Pt verbalized understanding and agreement. Pt offered snack and drink while awaiting transport to home. Pt requested only H20. Safety maintained.

## 2024-04-27 NOTE — ED Provider Notes (Cosign Needed Addendum)
 Behavioral Health Urgent Care Medical Screening Exam  Patient Name: Kenneth Foley MRN: [id number] Date of Evaluation: 04/27/24  Chief Complaint: Per Triage note, [name] 23y male arrived to [facility] by BHRT. PT states he is diagnosed with depression and autism. No medications are being taken per the pt. PT shares that he got into a heated argument with his controlling mother this morning. PT shared that ever since his father passed away in 05-18-21, he has been grieving and feels his family is very dismissive they don't see me as equal. PT states that he is the youngest of siblings and he gets talked to in a condescending manner. PT stated his mental state goes down when he bottles up his emotions and would just like someone to talk to. PT denies SI, HI, AVH and alcohol and substance use.   Diagnosis:  Final diagnoses:  Family discord  History of autism    History of Present illness: Kenneth Foley is a 23 y.o. male patient with a reported psychiatric history of autism, and depression who presented to the [facility] voluntary accompanied by the Behavioral Health Response Team after a verbal altercation with his mother.  Patient states that the verbal altercation was about his independence and states that he woke up early this morning and his mother did not like it and kicked him out of the house. He states that it was a power move by his mother because she would rather tell him to leave the house for a couple hours than to talk to him. He states that his mother and brother have a different way of thinking and they do not think outside the box. He states that his mother typically kicks him out of the house for couple hours at least once or twice per month when she gets upset with him. He states that when his mother kicks him out the house he usually goes walking for a couple hours and then goes back home but today it was misty the outside. When asked if he  made any suicidal or homicidal statements during the verbal altercation, he states no. He denies suicidal thoughts. He denies past suicide attempts or self-harm behaviors. He denies homicidal thoughts.  He denies auditory or visual hallucinations. He denies paranoia. Objectively, no signs of acute psychosis. He reports some depressive symptoms since his father died in May 18, 2021 and states that he has been grieving. He reports fair sleep. He reports a fair appetite. He denies drinking alcohol or using illicit drugs. He denies a medical history. He denies taking prescribed medications. He denies outpatient psychiatry or counseling. He denies past inpatient psychiatric hospitalizations.  He resides with his mother and 30 year old brother. He denies access to firearms. He reports feeling safe to return back home today. He is unemployed and receives disability. He states that he pays $800 per month to his mother for rent. He denies a family psychiatric history, no completed suicide, mental illness or substance abuse.  Collateral obtained by Deland Louder, [facility], Clinician contacted patient's mother, Bartholome [phone]) with his verbal consent. She denied any safety concerns, and she states there are no weapons in the home. She is informed patient has requested counseling referrals and will have resources provided in his discharge paperwork. She engaged in safety planning and asked how patient would get home. Patient's mother informed that patient will return via BHRT.   Plan of care: Patient recommended to follow-up with outpatient psychiatry for counseling services to address  current stressors, depression and grief. Will provide patient with outpatient resources for psychiatry and counseling services. Safety planning completed with the patient prior to discharge. Patient will be transported back home by the Behavioral Health Response Team.  Flowsheet Row ED from 04/27/2024 in [facility] from 06/02/2023 in [facility] at [facility] from 05/07/2023 in [facility] at [facility] RISK CATEGORY No Risk No Risk No Risk    Psychiatric Specialty Exam  Presentation  General Appearance:Appropriate for Environment  Eye Contact:Fair  Speech:Slow  Speech Volume:Normal   Mood and Affect  Mood: Dysphoric  Affect: Appropriate   Thought Process  Thought Processes: Coherent  Descriptions of Associations:Intact  Orientation:Full (Time, Place and Person)  Thought Content:Logical    Hallucinations:None  Ideas of Reference:None  Suicidal Thoughts:No  Homicidal Thoughts:No   Sensorium  Memory: Immediate Fair; Recent Fair; Remote Fair  Judgment: Fair  Insight: Fair   Art Therapist  Concentration: Fair  Attention Span: Fair  Recall: [employer] of Knowledge: Fair  Language: Fair   Psychomotor Activity  Psychomotor Activity: Normal   Assets  Assets: Manufacturing Systems Engineer; Desire for Improvement; Financial Resources/Insurance; Housing; Leisure Time   Sleep  Sleep: Fair   Physical Exam: Physical Exam Cardiovascular:     Rate and Rhythm: Normal rate.  Pulmonary:     Effort: Pulmonary effort is normal.  Musculoskeletal:        General: Normal range of motion.     Cervical back: Normal range of motion.  Neurological:     Mental Status: He is alert and oriented to person, place, and time.    Review of Systems  Constitutional: Negative.   HENT: Negative.    Eyes: Negative.   Respiratory: Negative.    Cardiovascular: Negative.   Gastrointestinal: Negative.   Genitourinary: Negative.   Musculoskeletal: Negative.   Neurological: Negative.    Blood pressure 136/85, pulse 80, temperature 98.2 F (36.8 C), temperature source Oral, resp. rate 16, SpO2 98%. There is no height or weight on file to calculate BMI.  Musculoskeletal: Strength & Muscle Tone: within normal limits Gait &  Station: normal Patient leans: N/A   [facility] MSE Discharge Disposition for Follow up and Recommendations: Based on my evaluation the patient does not appear to have an emergency medical condition and can be discharged with resources and follow up care in outpatient services for Individual Therapy  Discharge recommendations:   Outpatient Follow up: Please review list of outpatient resources for psychiatry and counseling. Please follow up with your primary care provider for all medical related needs.   Outpatient Therapy and Psychiatry for Medicare Recipients  [facility] [address] [place], [place], [address] [phone] phone  [facility] [address] [place], [place], [address] [address],5TH FLOOR phone ([address] [employer] [address] - [place], [address], [place], [place], [employer], [address], [employer] Healthy [place], [place], [address], [place], [place], [place], [employer], [employer], [employer], [employer], [employer])  [facility] [address], [address] [place], [place], [address] [phone] phone  [facility] [address] [place], [place], [address] [phone] phone  [facility] [address] [place], [place], [address] [phone] phone [phone] fax  [facility], [place] [address], [place], [address] [phone] phone  [facility] [address] [place], [place], [address] [phone] phone [phone] fax  [facility]  [address] [place], [place], [address] [phone] phone  Janit Griffins [address] [place], [place], [address] [phone] phone  [facility] [address] [place], [place], [address] [phone] phone [phone] fax  Therapy: We recommend that patient participate in individual therapy to address mental health  concerns.  Safety:   The following safety precautions should be taken:   No sharp objects. This includes scissors, razors, scrapers, and putty knives.   Chemicals should be removed and locked up.   Medications should be removed and locked up.   Weapons should be removed and locked up. This includes firearms, knives and instruments that can be used to cause injury.   The patient should abstain from use of illicit substances/drugs and abuse of any medications.  If symptoms worsen or do not continue to improve or if the patient becomes actively suicidal or homicidal then it is recommended that the patient return to the closest hospital emergency department, the [facility], or call 911 for further evaluation and treatment. National Suicide Prevention Lifeline 1-800-SUICIDE or 662-385-5956.  About 988 988 offers 24/7 access to trained crisis counselors who can help people experiencing mental health-related distress. People can call or text 988 or chat 988lifeline.org for themselves or if they are worried about a loved one who may need crisis support.    Maui Ahart L, NP 04/27/2024, 9:58 AM

## 2024-05-19 ENCOUNTER — Encounter: Payer: Self-pay | Admitting: Student

## 2024-05-19 ENCOUNTER — Ambulatory Visit (INDEPENDENT_AMBULATORY_CARE_PROVIDER_SITE_OTHER): Admitting: Student

## 2024-05-19 VITALS — BP 120/86 | HR 64 | Ht 68.0 in | Wt 140.0 lb

## 2024-05-19 DIAGNOSIS — Z0000 Encounter for general adult medical examination without abnormal findings: Secondary | ICD-10-CM | POA: Diagnosis present

## 2024-05-19 DIAGNOSIS — Z131 Encounter for screening for diabetes mellitus: Secondary | ICD-10-CM | POA: Diagnosis not present

## 2024-05-19 DIAGNOSIS — Z13228 Encounter for screening for other metabolic disorders: Secondary | ICD-10-CM | POA: Diagnosis not present

## 2024-05-19 LAB — POCT GLYCOSYLATED HEMOGLOBIN (HGB A1C): Hemoglobin A1C: 5.1 % (ref 4.0–5.6)

## 2024-05-19 NOTE — Assessment & Plan Note (Signed)
 PHQ score not completed Blood pressure reviewed and at goal.  Considered the following items based upon USPSTF recommendations: Diabetes screening: ordered HIV testing: Not high risk Hepatitis C: Previously completed, not high risk Hepatitis B: Not high risk Syphilis if at high risk: Not high risk GC/CT not high risk Lipid panel (nonfasting or fasting) discussed based upon AHA recommendations and recently completed and repeat not yet indicated.  Consider repeat every 4-6 years.  Vaccinations declined COVID/flu.   Follow up in 1 year or sooner if indicated.  MyChart Activation: Already signed up

## 2024-05-19 NOTE — Patient Instructions (Signed)
 It was great to see you! Thank you for allowing me to participate in your care!   I recommend that you always bring your medications to each appointment as this makes it easy to ensure we are on the correct medications and helps us  not miss when refills are needed.  Our plans for today:  - We are checking some labs today, I will call you if they are abnormal will send you a MyChart message or a letter if they are normal.  If you do not hear about your labs in the next 2 weeks please let us  know. - Follow-up in 1 year or sooner if needed  Take care and seek immediate care sooner if you develop any concerns. Please remember to show up 15 minutes before your scheduled appointment time!  Gladis Church, DO [facility]

## 2024-05-19 NOTE — Progress Notes (Unsigned)
    SUBJECTIVE:   Chief compliant/HPI: annual examination  Kenneth Foley is a 23 y.o. who presents today for an annual exam.   Reviewed and updated history.    OBJECTIVE:   BP 120/86   Pulse 64   Ht 5' 8 (1.727 m)   Wt 140 lb (63.5 kg)   SpO2 98%   BMI 21.29 kg/m    General: NAD, pleasant  Cardio: RRR, no MRG. Respiratory: CTAB, normal wob on RA Skin: Warm and dry  ASSESSMENT/PLAN:   Assessment & Plan Annual physical exam PHQ score not completed Blood pressure reviewed and at goal.  Considered the following items based upon USPSTF recommendations: Diabetes screening: ordered HIV testing: Not high risk Hepatitis C: Previously completed, not high risk Hepatitis B: Not high risk Syphilis if at high risk: Not high risk GC/CT not high risk Lipid panel (nonfasting or fasting) discussed based upon AHA recommendations and recently completed and repeat not yet indicated.  Consider repeat every 4-6 years.  Vaccinations declined COVID/flu.   Follow up in 1 year or sooner if indicated.  MyChart Activation: Already signed up  Gladis Church, DO [facility]    Gladis Church, [facility]

## 2024-05-20 ENCOUNTER — Ambulatory Visit: Payer: Self-pay | Admitting: Student

## 2024-05-20 LAB — BASIC METABOLIC PANEL WITH GFR
BUN/Creatinine Ratio: 11 (ref 9–20)
BUN: 10 mg/dL (ref 6–20)
CO2: 24 mmol/L (ref 20–29)
Calcium: 9.6 mg/dL (ref 8.7–10.2)
Chloride: 102 mmol/L (ref 96–106)
Creatinine, Ser: 0.88 mg/dL (ref 0.76–1.27)
Glucose: 81 mg/dL (ref 70–99)
Potassium: 4.3 mmol/L (ref 3.5–5.2)
Sodium: 138 mmol/L (ref 134–144)
eGFR: 123 mL/min/1.73 (ref >59)

## 2025-03-17 ENCOUNTER — Encounter
